# Patient Record
Sex: Female | Born: 1935 | Race: Black or African American | Hispanic: No | Smoking: Never smoker
Health system: Southern US, Community
[De-identification: ages and names within clinical notes are randomized; demographics above are authoritative.]

## PROBLEM LIST (undated history)

## (undated) DIAGNOSIS — R000 Tachycardia, unspecified: Secondary | ICD-10-CM

## (undated) DIAGNOSIS — I82409 Acute embolism and thrombosis of unspecified deep veins of unspecified lower extremity: Secondary | ICD-10-CM

## (undated) DIAGNOSIS — I119 Hypertensive heart disease without heart failure: Secondary | ICD-10-CM

## (undated) DIAGNOSIS — R011 Cardiac murmur, unspecified: Secondary | ICD-10-CM

## (undated) DIAGNOSIS — I739 Peripheral vascular disease, unspecified: Secondary | ICD-10-CM

## (undated) DIAGNOSIS — I2510 Atherosclerotic heart disease of native coronary artery without angina pectoris: Secondary | ICD-10-CM

## (undated) DIAGNOSIS — R0602 Shortness of breath: Secondary | ICD-10-CM

## (undated) DIAGNOSIS — I779 Disorder of arteries and arterioles, unspecified: Secondary | ICD-10-CM

## (undated) DIAGNOSIS — M1990 Unspecified osteoarthritis, unspecified site: Secondary | ICD-10-CM

## (undated) DIAGNOSIS — I10 Essential (primary) hypertension: Secondary | ICD-10-CM

## (undated) DIAGNOSIS — E7800 Pure hypercholesterolemia, unspecified: Secondary | ICD-10-CM

## (undated) DIAGNOSIS — G459 Transient cerebral ischemic attack, unspecified: Secondary | ICD-10-CM

## (undated) HISTORY — DX: Disorder of arteries and arterioles, unspecified: I77.9

## (undated) HISTORY — PX: CATARACT EXTRACTION W/ INTRAOCULAR LENS  IMPLANT, BILATERAL: SHX1307

---

## 2003-05-19 ENCOUNTER — Encounter: Admission: RE | Admit: 2003-05-19 | Discharge: 2003-06-07 | Payer: Self-pay | Admitting: Internal Medicine

## 2005-03-06 ENCOUNTER — Encounter: Admission: RE | Admit: 2005-03-06 | Discharge: 2005-03-06 | Payer: Self-pay | Admitting: Internal Medicine

## 2007-02-18 ENCOUNTER — Emergency Department (HOSPITAL_COMMUNITY): Admission: EM | Admit: 2007-02-18 | Discharge: 2007-02-18 | Payer: Self-pay | Admitting: Emergency Medicine

## 2007-04-14 ENCOUNTER — Inpatient Hospital Stay (HOSPITAL_COMMUNITY): Admission: EM | Admit: 2007-04-14 | Discharge: 2007-04-18 | Payer: Self-pay | Admitting: Emergency Medicine

## 2007-04-16 ENCOUNTER — Encounter (INDEPENDENT_AMBULATORY_CARE_PROVIDER_SITE_OTHER): Payer: Self-pay | Admitting: *Deleted

## 2007-04-16 ENCOUNTER — Ambulatory Visit: Payer: Self-pay | Admitting: Vascular Surgery

## 2007-04-16 ENCOUNTER — Encounter (INDEPENDENT_AMBULATORY_CARE_PROVIDER_SITE_OTHER): Payer: Self-pay | Admitting: Cardiology

## 2007-08-10 ENCOUNTER — Emergency Department (HOSPITAL_COMMUNITY): Admission: EM | Admit: 2007-08-10 | Discharge: 2007-08-10 | Payer: Self-pay | Admitting: Emergency Medicine

## 2008-06-25 ENCOUNTER — Emergency Department (HOSPITAL_COMMUNITY): Admission: EM | Admit: 2008-06-25 | Discharge: 2008-06-25 | Payer: Self-pay | Admitting: Emergency Medicine

## 2008-07-06 ENCOUNTER — Emergency Department (HOSPITAL_COMMUNITY): Admission: EM | Admit: 2008-07-06 | Discharge: 2008-07-06 | Payer: Self-pay | Admitting: Emergency Medicine

## 2008-12-20 ENCOUNTER — Emergency Department (HOSPITAL_COMMUNITY): Admission: EM | Admit: 2008-12-20 | Discharge: 2008-12-21 | Payer: Self-pay | Admitting: Emergency Medicine

## 2009-01-21 ENCOUNTER — Inpatient Hospital Stay (HOSPITAL_COMMUNITY): Admission: EM | Admit: 2009-01-21 | Discharge: 2009-01-24 | Payer: Self-pay | Admitting: Emergency Medicine

## 2009-01-23 ENCOUNTER — Encounter (INDEPENDENT_AMBULATORY_CARE_PROVIDER_SITE_OTHER): Payer: Self-pay | Admitting: Internal Medicine

## 2009-12-06 ENCOUNTER — Emergency Department (HOSPITAL_COMMUNITY): Admission: EM | Admit: 2009-12-06 | Discharge: 2009-12-07 | Payer: Self-pay | Admitting: Emergency Medicine

## 2009-12-11 ENCOUNTER — Encounter: Admission: RE | Admit: 2009-12-11 | Discharge: 2009-12-11 | Payer: Self-pay | Admitting: Internal Medicine

## 2010-05-10 LAB — URINALYSIS, ROUTINE W REFLEX MICROSCOPIC
Nitrite: NEGATIVE
Protein, ur: 100 mg/dL — AB
Specific Gravity, Urine: 1.017 (ref 1.005–1.030)
Urobilinogen, UA: 1 mg/dL (ref 0.0–1.0)

## 2010-05-10 LAB — URINE MICROSCOPIC-ADD ON

## 2010-05-10 LAB — POCT I-STAT, CHEM 8
BUN: 14 mg/dL (ref 6–23)
Calcium, Ion: 1.16 mmol/L (ref 1.12–1.32)
Chloride: 95 mEq/L — ABNORMAL LOW (ref 96–112)
Potassium: 3.4 mEq/L — ABNORMAL LOW (ref 3.5–5.1)

## 2010-05-10 LAB — DIFFERENTIAL
Basophils Absolute: 0 10*3/uL (ref 0.0–0.1)
Lymphocytes Relative: 7 % — ABNORMAL LOW (ref 12–46)
Monocytes Absolute: 0.9 10*3/uL (ref 0.1–1.0)
Monocytes Relative: 9 % (ref 3–12)
Neutro Abs: 9.2 10*3/uL — ABNORMAL HIGH (ref 1.7–7.7)

## 2010-05-10 LAB — HEPATIC FUNCTION PANEL
ALT: 14 U/L (ref 0–35)
AST: 27 U/L (ref 0–37)
Alkaline Phosphatase: 39 U/L (ref 39–117)
Indirect Bilirubin: 0.7 mg/dL (ref 0.3–0.9)
Total Protein: 6.3 g/dL (ref 6.0–8.3)

## 2010-05-10 LAB — CBC
HCT: 34.8 % — ABNORMAL LOW (ref 36.0–46.0)
Hemoglobin: 12.1 g/dL (ref 12.0–15.0)
WBC: 10.9 10*3/uL — ABNORMAL HIGH (ref 4.0–10.5)

## 2010-05-10 LAB — POCT CARDIAC MARKERS
CKMB, poc: 1.5 ng/mL (ref 1.0–8.0)
Troponin i, poc: <0.05 ng/mL (ref 0.00–0.09)

## 2010-05-30 LAB — LIPID PANEL: Triglycerides: 176 mg/dL — ABNORMAL HIGH (ref <150)

## 2010-05-30 LAB — BASIC METABOLIC PANEL
BUN: 13 mg/dL (ref 6–23)
BUN: 7 mg/dL (ref 6–23)
BUN: 7 mg/dL (ref 6–23)
CO2: 22 mEq/L (ref 19–32)
CO2: 25 mEq/L (ref 19–32)
CO2: 26 mEq/L (ref 19–32)
CO2: 26 mEq/L (ref 19–32)
Calcium: 9.1 mg/dL (ref 8.4–10.5)
Calcium: 9.4 mg/dL (ref 8.4–10.5)
Chloride: 101 mEq/L (ref 96–112)
Chloride: 106 mEq/L (ref 96–112)
Chloride: 109 mEq/L (ref 96–112)
Creatinine, Ser: 0.85 mg/dL (ref 0.4–1.2)
Creatinine, Ser: 0.85 mg/dL (ref 0.4–1.2)
Creatinine, Ser: 0.93 mg/dL (ref 0.4–1.2)
GFR calc Af Amer: >60 mL/min (ref >60)
GFR calc non Af Amer: 56 mL/min — ABNORMAL LOW (ref >60)
Glucose, Bld: 110 mg/dL — ABNORMAL HIGH (ref 70–99)
Glucose, Bld: 96 mg/dL (ref 70–99)
Glucose, Bld: 99 mg/dL (ref 70–99)
Potassium: 3.2 mEq/L — ABNORMAL LOW (ref 3.5–5.1)
Potassium: 4.1 mEq/L (ref 3.5–5.1)

## 2010-05-30 LAB — CBC
HCT: 39.7 % (ref 36.0–46.0)
Hemoglobin: 13.5 g/dL (ref 12.0–15.0)
MCV: 95.7 fL (ref 78.0–100.0)
RBC: 4.17 MIL/uL (ref 3.87–5.11)
RDW: 13.7 % (ref 11.5–15.5)
WBC: 10.8 10*3/uL — ABNORMAL HIGH (ref 4.0–10.5)
WBC: 7.1 10*3/uL (ref 4.0–10.5)

## 2010-05-30 LAB — CARDIAC PANEL(CRET KIN+CKTOT+MB+TROPI)
CK, MB: 1.6 ng/mL (ref 0.3–4.0)
CK, MB: 1.7 ng/mL (ref 0.3–4.0)
CK, MB: 3 ng/mL (ref 0.3–4.0)
Relative Index: INVALID (ref 0.0–2.5)
Relative Index: INVALID (ref 0.0–2.5)
Total CK: 79 U/L (ref 7–177)
Total CK: 99 U/L (ref 7–177)
Troponin I: 0.04 ng/mL (ref 0.00–0.06)
Troponin I: 0.19 ng/mL — ABNORMAL HIGH (ref 0.00–0.06)

## 2010-05-30 LAB — URINALYSIS, ROUTINE W REFLEX MICROSCOPIC
Glucose, UA: NEGATIVE mg/dL
pH: 7 (ref 5.0–8.0)

## 2010-05-30 LAB — DIGOXIN LEVEL: Digoxin Level: 0.8 ng/mL (ref 0.8–2.0)

## 2010-05-30 LAB — DIFFERENTIAL
Eosinophils Relative: 2 % (ref 0–5)
Lymphocytes Relative: 23 % (ref 12–46)
Lymphs Abs: 1.7 10*3/uL (ref 0.7–4.0)
Monocytes Absolute: 0.8 10*3/uL (ref 0.1–1.0)

## 2010-05-30 LAB — CK TOTAL AND CKMB (NOT AT ARMC)
CK, MB: 1.5 ng/mL (ref 0.3–4.0)
Total CK: 74 U/L (ref 7–177)

## 2010-05-30 LAB — TSH: TSH: 1.068 u[IU]/mL (ref 0.350–4.500)

## 2010-05-30 LAB — URINE MICROSCOPIC-ADD ON

## 2010-05-30 LAB — BRAIN NATRIURETIC PEPTIDE: Pro B Natriuretic peptide (BNP): 191 pg/mL — ABNORMAL HIGH (ref 0.0–100.0)

## 2010-05-30 LAB — URINE CULTURE

## 2010-05-30 LAB — POCT CARDIAC MARKERS

## 2010-05-31 LAB — POCT CARDIAC MARKERS
CKMB, poc: <1 ng/mL — ABNORMAL LOW (ref 1.0–8.0)
Myoglobin, poc: 54.6 ng/mL (ref 12–200)
Myoglobin, poc: 77.7 ng/mL (ref 12–200)
Troponin i, poc: <0.05 ng/mL (ref 0.00–0.09)

## 2010-05-31 LAB — DIFFERENTIAL
Basophils Absolute: 0 10*3/uL (ref 0.0–0.1)
Basophils Relative: 0 % (ref 0–1)
Eosinophils Relative: 1 % (ref 0–5)
Lymphocytes Relative: 16 % (ref 12–46)
Monocytes Absolute: 0.4 10*3/uL (ref 0.1–1.0)
Neutro Abs: 5.8 10*3/uL (ref 1.7–7.7)

## 2010-05-31 LAB — HEPATIC FUNCTION PANEL
ALT: 16 U/L (ref 0–35)
Albumin: 3.5 g/dL (ref 3.5–5.2)
Alkaline Phosphatase: 42 U/L (ref 39–117)
Total Protein: 6.7 g/dL (ref 6.0–8.3)

## 2010-05-31 LAB — CBC
MCHC: 34.1 g/dL (ref 30.0–36.0)
Platelets: 191 10*3/uL (ref 150–400)
RDW: 13.9 % (ref 11.5–15.5)

## 2010-05-31 LAB — DIGOXIN LEVEL: Digoxin Level: 0.3 ng/mL — ABNORMAL LOW (ref 0.8–2.0)

## 2010-05-31 LAB — POCT I-STAT, CHEM 8: Glucose, Bld: 189 mg/dL — ABNORMAL HIGH (ref 70–99)

## 2010-05-31 LAB — LIPASE, BLOOD: Lipase: 30 U/L (ref 11–59)

## 2010-06-05 LAB — CBC
HCT: 36.2 % (ref 36.0–46.0)
Hemoglobin: 12.4 g/dL (ref 12.0–15.0)
MCHC: 34.3 g/dL (ref 30.0–36.0)
RBC: 3.87 MIL/uL (ref 3.87–5.11)
RDW: 12.3 % (ref 11.5–15.5)

## 2010-06-05 LAB — DIFFERENTIAL
Eosinophils Relative: 2 % (ref 0–5)
Lymphocytes Relative: 22 % (ref 12–46)
Monocytes Absolute: 1 10*3/uL (ref 0.1–1.0)
Monocytes Relative: 14 % — ABNORMAL HIGH (ref 3–12)
Neutro Abs: 4.3 10*3/uL (ref 1.7–7.7)

## 2010-06-05 LAB — POCT CARDIAC MARKERS: Troponin i, poc: <0.05 ng/mL (ref 0.00–0.09)

## 2010-06-05 LAB — POCT I-STAT, CHEM 8
BUN: 19 mg/dL (ref 6–23)
Calcium, Ion: 1.26 mmol/L (ref 1.12–1.32)
Chloride: 103 mEq/L (ref 96–112)
Glucose, Bld: 113 mg/dL — ABNORMAL HIGH (ref 70–99)
TCO2: 26 mmol/L (ref 0–100)

## 2010-07-10 NOTE — Discharge Summary (Signed)
NAME:  SANDY, HAYE NO.:  [id number]   MEDICAL RECORD NO.:  [id number]          PATIENT TYPE:  INP   LOCATION:  3002                         FACILITY:  [facility]   PHYSICIAN:  Thomasenia Bottoms, MDDATE OF BIRTH:  Mar 19, 1914   DATE OF ADMISSION:  04/13/2007  DATE OF DISCHARGE:  04/18/2007                               DISCHARGE SUMMARY   SUMMARY OF HOSPITAL COURSE:  Ms. Botto is a very pleasant, independent  77 year old woman who came in with complaints of right-sided weakness.  She does also report that several days prior to presentation she had  severe left-sided weakness which resolved.  The CT scan in the emergency  department revealed a new lacunar infarct in the right internal capsule  and some chronic ischemic changes.  The patient was admitted to the  telemetry unit where she had a complete workup.  Her symptoms resolved.  She was seen by physical therapy and home PT was recommended, but  clinically she did not have a significant weakness on exam.  She does  walk with a walker and the patient does live by herself.  Physical  therapy did recommend that she have 24-hour supervision.   SUMMARY OF STUDIES DONE DURING THIS HOSPITAL STAY:  Include brain MRI  and MRA which revealed the small acute infarct in the posterior limb of  the right internal capsule, some atrophy and mild chronic ischemia.  She  also had intracranial atherosclerosis including moderate stenosis of the  proximal posterior cerebral arteries bilaterally.  The left vertebral  artery was not visualized and may be occluded or hyperplastic.  Mild  atherosclerotic irregularity in the middle cerebral arteries bilaterally  as well, but no evidence of large-vessel occlusion.  The patient also  had an echocardiogram which revealed a small left ventricle with an EF  of 75-80%, increased thickness of the left ventricular wall, but no  evidence of wall motion abnormalities.  She had a small left  ventricle,  small right ventricle, mild calcification of the mitral valve and it was  felt that her findings were consistent with hypertrophic cardiomyopathy.  The patient's hemoglobin A1c was 6.4.  Urine culture revealed greater  than 100,000 Klebsiella pneumonia which were sensitive to Cipro.  Her  hemoglobin 11.8, last white count was 11.9.  Homocysteine level within  normal limits at 10.5.  TSH within normal limits at 0.976.  The  patient's fasting cholesterol was 203 with an LDL of 134, triglycerides  high at 191, and LDL low at 31.   DISCHARGE DIAGNOSES:  1. Acute right internal capsule cerebrovascular accident with no      significant residual weakness.  The patient was on aspirin at the      time of the stroke so she will be switched to Plavix.  2. Distant history of atrial fibrillation.  I had a very long      discussion with the patient and her family about the data      recommending Coumadin for people with history of atrial      fibrillation particularly in the setting of a stroke, but the  family and the patient were very clear that she did not want to      take Coumadin so because of this she will be sent home on Plavix as      mentioned above.  The patient's EKGs here reveal that she is in      normal sinus rhythm.  3. Hypercholesterolemia.  The patient was already taking a statin but      we will increase the dose from 20 mg to 40 mg as her LDL was still      greater than 100.  4. Hypertension.  Her blood pressure was controlled while she was here      in the hospital.  5. Hypokalemia.  This was repleted.  I suspect her hydrochlorothiazide      has caused this.  The patient may be able to come off the      hydrochlorothiazide as well as the potassium if her primary care      doctors agree with this.  6. Diabetes mellitus.  The patient does not require medication.  She      does not really follow any sort of diet.  As a matter of fact, she      did not know that  she had diabetes.  I have recommended diet      control.  Her hemoglobin A1c was 6.4.  7. Klebsiella urinary tract infection.  The patient will be treated      for a total of 5 days with Cipro.  8. Hypertrophic cardiomyopathy, likely secondary to hypertension.   MEDICATIONS AT THE TIME OF DISCHARGE:  1. Plavix 75 mg p.o. daily, which is new.  We have asked her to      discontinue her aspirin 325 mg.  2. Pravastatin 40 mg p.o. q.h.s. which is increased from 20.  3. Lanoxin 0.125 mg p.o. daily as before.  4. Atenolol 50 mg one-half tablet daily as before.  5. Altace 2.5 mg p.o. daily.  This ACE inhibitor is new.  6. She is on Alphagan eye drops 0.15% one drop in each eye twice      daily.  7. Timolol eye drops one drop in each eye twice daily.   The patient is being asked to follow up with Dr. Nicholos Johns within the  next 2 weeks.      Thomasenia Bottoms, MD  Electronically Signed     CVC/MEDQ  D:  04/18/2007  T:  04/19/2007  Job:  [id number]   cc:   Georgianne Fick, M.D.

## 2010-07-10 NOTE — H&P (Signed)
NAME:  Pamela Hayes, Pamela Hayes              ACCOUNT NO.:  [id number]   MEDICAL RECORD NO.:  [id number]          PATIENT TYPE:  INP   LOCATION:  1823                         FACILITY:  [facility]   PHYSICIAN:  Darryl D. Prime, MD    DATE OF BIRTH:  [birth year]   DATE OF ADMISSION:  04/13/2007  DATE OF DISCHARGE:                              HISTORY & PHYSICAL   PRIMARY CARE PHYSICIAN:  Georgianne Fick, M.D.   She will be DNR after a discussion with the patient directly.  The  patient total visit time was approximately 45 minutes.  The patient was  the historian.  She is a good historian.   CHIEF COMPLAINT:  Numbness of the right arm and weakness.   HISTORY OF PRESENT ILLNESS:  Ms. Soderholm is a 77 year old female who  presents tonight with complaints of right-sided weakness which began on  February 16 in the daytime.  She is unsure of the exact time.  The  patient apparently notes initially pain in the right arm, which was then  followed by numbness and significant weakness.  She notes that only now  since being in the ER have these symptoms completely resolved.  A week  ago she notes pain and numbness in the left arm and the pain radiated to  the chest area but did not seek medical attention.  Apparently she was  seen in the emergency room for possible syncope in December of 2008.  The patient takes her medication at home and she notes also taking four  baby aspirins when she had these symptoms earlier.  She denies any  dysuria, hematuria.  She denies any black stools or bloody stools.  She  denies any fevers, coughs, or rash.  She denies any worsening joint  pains or swelling in the joints.  The patient performs all her  activities of daily living with minor assistance from her family who  lives nearby.   PAST MEDICAL/SURGICAL HISTORY:  As above.  1. She also has a history of hypertension.  2. History of possible cardiac arrhythmia.  3. History of hyperlipidemia.  4. History of  osteopenia.  5. History of hypokalemia.   MEDICATIONS:  1. She is on Tylenol 25 mg daily.  2. Hydrochlorothiazide 50 mg daily.  3. Digoxin 0.125 mg daily.  4. She is on Pravastatin 20 mg daily.  5. Potassium chloride in the form of Klor-Con 20 daily.  6. Aspirin 325 mg daily.  7. Calcium oyster shells 500 mg three times a day.  8. The patient is also on antacid called Titralac 100 mg twice a day.  9. She is also on a multivitamin.   SOCIAL HISTORY:  She denies ever using tobacco, alcohol, or drugs.  She  lives by herself.   FAMILY HISTORY:  Positive for coronary disease in the father and  multiple strokes in her mother before they passed away.   ALLERGIES:  She has no known drug allergies.   REVIEW OF SYSTEMS:  A 14-point review of systems was negative unless  stated above.   PHYSICAL EXAMINATION:  VITAL SIGNS:  Temperature is 97.4  with her blood  pressure 141/80, pulse was 76, respiratory rate was 19, saturations 96%  on room air.  GENERAL:  She is a female that looks much younger than her stated age,  lying in bed in no acute distress.  HEENT:  Normocephalic, atraumatic.  Her pupils are equal, round, and  reactive to light with extraocular movements being intact.  Nares show  no lesions.  Oropharynx with no posterior pharyngeal lesions.  NECK:  Supple with no lymphadenopathy, no thyromegaly.  No carotid  bruits appreciated.  No jugular venous distention.  CARDIOVASCULAR:  Regular rhythm and rate.  There is what appears to be a  2/6 holosystolic murmur, loudest at the apex laterally across the  precordium with maybe radiation to the left axilla area.  LUNGS:  Clear to auscultation bilaterally.  ABDOMEN:  Obese, soft, nontender, nondistended.  EXTREMITIES:  Showed no clubbing, cyanosis, or edema.  NEUROLOGIC:  Sensation is grossly intact.  Strength is 5/5 throughout.  Cranial nerves II-XII are grossly intact.  Deep tendon reflexes are  intact.   LABORATORY DATA:   Creatinine 1.1, sodium 140, potassium 3.5, chloride  104, bicarb 3.5, BUN 19.  Her white blood cell count was 9.3, platelets  199.  Her glucose is 112.  Troponin was 0.01 at approximately midnight.  CK-MB was 1.3, CK 90.   The patient's EKG showed normal sinus rhythm at a rate of 64 with left  ventricular hypertrophy with repolarization abnormalities.  Chest x-ray  showed a widened mediastinum, hepatic aorta and signs of kyphosis and  osteopenia.  There was no cardiomegaly.  CT of the head without contrast  was concerning from a right lacunar stroke which is possibly old.   ASSESSMENT/PLAN:  This is a patient with a family history positive for  strokes and history concerning for possible transient ischemic attacks.  She will be admitted with neuro checks q.4 h. for telemetry and she will  be on Aggrenox and folic acid.  Will get an MRI in the a.m. and also  ultrasound transcranial Dopplers throughout source of stroke.  These  things are significant to get a neurology consult.  For possible mitral  regurgitant murmur,  we will get an echocardiogram as the physical exam  is not clear-cut.  For her arrhythmia history will get a digoxin level  and place her on telemetry.  Will check a TSH as well.  She will be on  aspirin.  Doubt this is the cause of the symptoms, but certainly may put  her at increased risk for stroke if she has atrial fibrillation.  For  her possible chest pain a week ago will get cardiac markers x3 and EKG  in the morning.  She will be Do Not Resuscitate after discussion with  the patient.  For her hyperlipidemia history and recent possible TIAs,  will check a lipid panel and will increase her Pravastatin to 40 mg  daily, DVT prophylaxis with an apparent GI prophylaxis with a proton  pump inhibitor.      Darryl D. Prime, MD  Electronically Signed     DDP/MEDQ  D:  04/14/2007  T:  04/14/2007  Job:  [id number]

## 2010-11-16 LAB — BASIC METABOLIC PANEL
BUN: 18
BUN: 3 — ABNORMAL LOW
BUN: 7
CO2: 25
CO2: 25
CO2: 25
Calcium: 8.4
Calcium: 8.9
Calcium: 9
Chloride: 103
Chloride: 103
Chloride: 107
Creatinine, Ser: 0.91
Creatinine, Ser: 0.96
Creatinine, Ser: 0.98
GFR calc Af Amer: >60
GFR calc Af Amer: >60
GFR calc Af Amer: >60
GFR calc non Af Amer: 53 — ABNORMAL LOW
GFR calc non Af Amer: 55 — ABNORMAL LOW
GFR calc non Af Amer: 58 — ABNORMAL LOW
Glucose, Bld: 125 — ABNORMAL HIGH
Glucose, Bld: 140 — ABNORMAL HIGH
Glucose, Bld: 159 — ABNORMAL HIGH
Glucose, Bld: 167 — ABNORMAL HIGH
Potassium: 2.8 — ABNORMAL LOW
Potassium: 3.1 — ABNORMAL LOW
Potassium: 3.3 — ABNORMAL LOW
Potassium: 3.5
Sodium: 137
Sodium: 138
Sodium: 139

## 2010-11-16 LAB — I-STAT 8, (EC8 V) (CONVERTED LAB)
Acid-Base Excess: 2
BUN: 19
Bicarbonate: 27.2 — ABNORMAL HIGH
Chloride: 104
Glucose, Bld: 112 — ABNORMAL HIGH
HCT: 43
Hemoglobin: 14.6
Operator id: 282201
Potassium: 3.5
Sodium: 140
TCO2: 29
pCO2, Ven: 43.7 — ABNORMAL LOW
pH, Ven: 7.403 — ABNORMAL HIGH

## 2010-11-16 LAB — PROTIME-INR: INR: 1.1

## 2010-11-16 LAB — URINE CULTURE
Colony Count: >=100000
Special Requests: NEGATIVE

## 2010-11-16 LAB — CARDIAC PANEL(CRET KIN+CKTOT+MB+TROPI)
Relative Index: INVALID
Total CK: 94
Troponin I: 0.02

## 2010-11-16 LAB — DIFFERENTIAL
Basophils Relative: 0
Eosinophils Absolute: 0.2
Eosinophils Relative: 2
Monocytes Absolute: 0.6
Monocytes Relative: 8

## 2010-11-16 LAB — CBC
HCT: 34.9 — ABNORMAL LOW
HCT: 39.8
Hemoglobin: 11.8 — ABNORMAL LOW
Hemoglobin: 12.6
Hemoglobin: 13.5
MCHC: 33.7
MCHC: 33.8
MCHC: 33.9
MCV: 93.8
MCV: 93.9
MCV: 95
Platelets: 194
Platelets: 199
RBC: 3.72 — ABNORMAL LOW
RBC: 3.94
RBC: 4.24
RDW: 13.2
RDW: 13.4
WBC: 11.9 — ABNORMAL HIGH
WBC: 9.3

## 2010-11-16 LAB — URINALYSIS, ROUTINE W REFLEX MICROSCOPIC
Hgb urine dipstick: NEGATIVE
Nitrite: NEGATIVE
Protein, ur: 30 — AB
Urobilinogen, UA: 1

## 2010-11-16 LAB — LIPID PANEL: Triglycerides: 191 — ABNORMAL HIGH

## 2010-11-16 LAB — HEMOGLOBIN A1C
Hgb A1c MFr Bld: 6.4 — ABNORMAL HIGH
Mean Plasma Glucose: 151

## 2010-11-16 LAB — TROPONIN I: Troponin I: 0.01

## 2010-11-16 LAB — URINE MICROSCOPIC-ADD ON

## 2010-11-16 LAB — APTT: aPTT: 25

## 2010-11-16 LAB — CK TOTAL AND CKMB (NOT AT ARMC)
CK, MB: 1.3
Relative Index: INVALID
Total CK: 90

## 2010-11-16 LAB — POCT I-STAT CREATININE
Creatinine, Ser: 1.1
Operator id: 282201

## 2010-11-22 LAB — DIFFERENTIAL
Basophils Absolute: 0
Basophils Relative: 0
Neutro Abs: 4.9
Neutrophils Relative %: 63

## 2010-11-22 LAB — CBC
MCHC: 33.7
RBC: 3.91
RDW: 13.5

## 2010-11-22 LAB — POCT CARDIAC MARKERS
CKMB, poc: <1 — ABNORMAL LOW
CKMB, poc: <1 — ABNORMAL LOW
Myoglobin, poc: 46.2

## 2010-11-22 LAB — URINALYSIS, ROUTINE W REFLEX MICROSCOPIC
Nitrite: NEGATIVE
Specific Gravity, Urine: 1.01
Urobilinogen, UA: 0.2

## 2010-11-22 LAB — URINE MICROSCOPIC-ADD ON

## 2010-11-22 LAB — POCT I-STAT, CHEM 8
HCT: 37
Hemoglobin: 12.6
Potassium: 3.6
Sodium: 133 — ABNORMAL LOW

## 2010-11-30 LAB — DIFFERENTIAL
Basophils Absolute: 0
Basophils Relative: 0
Monocytes Relative: 7
Neutro Abs: 4.8
Neutrophils Relative %: 52

## 2010-11-30 LAB — CBC
MCHC: 34.1
RBC: 4.01
WBC: 9.3

## 2010-11-30 LAB — URINALYSIS, ROUTINE W REFLEX MICROSCOPIC
Glucose, UA: NEGATIVE
Hgb urine dipstick: NEGATIVE
Ketones, ur: NEGATIVE
pH: 7.5

## 2010-11-30 LAB — I-STAT 8, (EC8 V) (CONVERTED LAB)
Glucose, Bld: 165 — ABNORMAL HIGH
HCT: 41
Hemoglobin: 13.9
Operator id: 284141
Potassium: 3.2 — ABNORMAL LOW
Sodium: 136
TCO2: 26

## 2010-11-30 LAB — URINE MICROSCOPIC-ADD ON

## 2010-11-30 LAB — POCT I-STAT CREATININE: Operator id: 284141

## 2011-10-04 ENCOUNTER — Emergency Department (HOSPITAL_COMMUNITY): Payer: Medicare Other

## 2011-10-04 ENCOUNTER — Emergency Department (HOSPITAL_COMMUNITY)
Admission: EM | Admit: 2011-10-04 | Discharge: 2011-10-05 | Disposition: A | Discharge status: Home / Self Care | Payer: Medicare Other | Attending: Emergency Medicine | Admitting: Emergency Medicine

## 2011-10-04 ENCOUNTER — Encounter (HOSPITAL_COMMUNITY): Payer: Self-pay | Admitting: *Deleted

## 2011-10-04 DIAGNOSIS — Z86718 Personal history of other venous thrombosis and embolism: Secondary | ICD-10-CM | POA: Insufficient documentation

## 2011-10-04 DIAGNOSIS — I10 Essential (primary) hypertension: Secondary | ICD-10-CM | POA: Insufficient documentation

## 2011-10-04 DIAGNOSIS — I2510 Atherosclerotic heart disease of native coronary artery without angina pectoris: Secondary | ICD-10-CM | POA: Insufficient documentation

## 2011-10-04 DIAGNOSIS — L0390 Cellulitis, unspecified: Secondary | ICD-10-CM

## 2011-10-04 DIAGNOSIS — F17200 Nicotine dependence, unspecified, uncomplicated: Secondary | ICD-10-CM | POA: Insufficient documentation

## 2011-10-04 DIAGNOSIS — E119 Type 2 diabetes mellitus without complications: Secondary | ICD-10-CM | POA: Insufficient documentation

## 2011-10-04 DIAGNOSIS — M7989 Other specified soft tissue disorders: Secondary | ICD-10-CM | POA: Insufficient documentation

## 2011-10-04 DIAGNOSIS — Z79899 Other long term (current) drug therapy: Secondary | ICD-10-CM | POA: Insufficient documentation

## 2011-10-04 HISTORY — DX: Atherosclerotic heart disease of native coronary artery without angina pectoris: I25.10

## 2011-10-04 HISTORY — DX: Essential (primary) hypertension: I10

## 2011-10-04 HISTORY — DX: Cardiac murmur, unspecified: R01.1

## 2011-10-04 LAB — CBC WITH DIFFERENTIAL/PLATELET
Basophils Relative: 0 % (ref 0–1)
Eosinophils Absolute: 0.2 10*3/uL (ref 0.0–0.7)
Eosinophils Relative: 3 % (ref 0–5)
Hemoglobin: 12.8 g/dL (ref 12.0–15.0)
Lymphs Abs: 1.8 10*3/uL (ref 0.7–4.0)
MCH: 31.4 pg (ref 26.0–34.0)
MCHC: 34.9 g/dL (ref 30.0–36.0)
MCV: 90.2 fL (ref 78.0–100.0)
Monocytes Absolute: 0.7 10*3/uL (ref 0.1–1.0)
Monocytes Relative: 11 % (ref 3–12)
RBC: 4.07 MIL/uL (ref 3.87–5.11)

## 2011-10-04 LAB — PROTIME-INR
INR: 1.04 (ref 0.00–1.49)
Prothrombin Time: 13.8 seconds (ref 11.6–15.2)

## 2011-10-04 LAB — COMPREHENSIVE METABOLIC PANEL
Alkaline Phosphatase: 50 U/L (ref 39–117)
BUN: 9 mg/dL (ref 6–23)
Calcium: 9.4 mg/dL (ref 8.4–10.5)
Creatinine, Ser: 0.82 mg/dL (ref 0.50–1.10)
GFR calc Af Amer: 69 mL/min — ABNORMAL LOW (ref >90)
Glucose, Bld: 107 mg/dL — ABNORMAL HIGH (ref 70–99)
Potassium: 3.2 mEq/L — ABNORMAL LOW (ref 3.5–5.1)
Total Protein: 7.4 g/dL (ref 6.0–8.3)

## 2011-10-04 MED ORDER — ENOXAPARIN SODIUM 60 MG/0.6ML ~~LOC~~ SOLN
60.0000 mg | Freq: Once | SUBCUTANEOUS | Status: AC
  Administered 2011-10-05: 60 mg via SUBCUTANEOUS
  Filled 2011-10-04: qty 0.6

## 2011-10-04 MED ORDER — CEPHALEXIN 500 MG PO CAPS: 500.0000 mg | ORAL_CAPSULE | Freq: Four times a day (QID) | ORAL | Status: AC

## 2011-10-04 MED ORDER — CEPHALEXIN 250 MG PO CAPS
500.0000 mg | ORAL_CAPSULE | Freq: Once | ORAL | Status: AC
  Administered 2011-10-05: 500 mg via ORAL
  Filled 2011-10-04: qty 2

## 2011-10-04 NOTE — ED Notes (Addendum)
Lt. Ankle swollen. No hx. Of blood clots. Sore to touch foot and when walking. Ankle warm to touch. Redness to lt, medial side of ankle.

## 2011-10-04 NOTE — ED Provider Notes (Signed)
History     CSN: [id number]  Arrival date & time 10/04/11  1827   First MD Initiated Contact with Patient 10/04/11 2216      Chief Complaint  Patient presents with  . Leg Swelling    (Consider location/radiation/quality/duration/timing/severity/associated sxs/prior treatment) HPI Comments: CAMPBELL AGRAMONTE is a 78 y.o. Female hx of HTN, R leg DVT, DM here with leg swelling. L leg swelling for the last few weeks. She might have fell a week ago and the swelling became worse. She denies any chest pain or SOB or fevers. She had remote hx of DVT and was not coumadin.     The history is provided by the patient.    Past Medical History  Diagnosis Date  . Heart murmur   . Coronary artery disease   . Hypertension     History reviewed. No pertinent past surgical history.  No family history on file.  History  Substance Use Topics  . Smoking status: Current Everyday Smoker  . Smokeless tobacco: Not on file  . Alcohol Use: No    OB History    Grav Para Term Preterm Abortions TAB SAB Ect Mult Living                  Review of Systems  Musculoskeletal: Positive for joint swelling.  All other systems reviewed and are negative.    Allergies  Review of patient's allergies indicates no known allergies.  Home Medications   Current Outpatient Rx  Name Route Sig Dispense Refill  . ATENOLOL 50 MG PO TABS Oral Take 25 mg by mouth daily.    Marland Kitchen CLOPIDOGREL BISULFATE 75 MG PO TABS Oral Take 75 mg by mouth daily.    Marland Kitchen DIGOXIN 0.125 MG PO TABS Oral Take 0.125 mg by mouth daily.    . DORZOLAMIDE HCL-TIMOLOL MAL 22.3-6.8 MG/ML OP SOLN Both Eyes Place 1 drop into both eyes 2 (two) times daily.    Marland Kitchen HYDROCHLOROTHIAZIDE 25 MG PO TABS Oral Take 25 mg by mouth daily.    Marland Kitchen PRAVASTATIN SODIUM 40 MG PO TABS Oral Take 40 mg by mouth daily.    Marland Kitchen RISEDRONATE SODIUM 35 MG PO TABS Oral Take 35 mg by mouth every 7 (seven) days. with water on empty stomach, nothing by mouth or lie down for next 30  minutes.    Marland Kitchen VERAPAMIL HCL ER 180 MG PO CP24 Oral Take 180 mg by mouth daily.      BP 156/81  Pulse 59  Temp 98.5 F (36.9 C) (Oral)  Resp 19  SpO2 100%  Physical Exam  Constitutional: She is oriented to person, place, and time. She appears well-developed and well-nourished.  HENT:  Head: Normocephalic.  Eyes: Conjunctivae are normal. Pupils are equal, round, and reactive to light.  Neck: Normal range of motion. Neck supple.  Cardiovascular: Normal rate, regular rhythm and normal heart sounds.   Pulmonary/Chest: Effort normal and breath sounds normal.  Abdominal: Soft. Bowel sounds are normal.  Musculoskeletal:       L ankle swollen ? Area of erythema medial aspect. No calf tenderness.   Neurological: She is alert and oriented to person, place, and time.  Skin: Skin is warm.  Psychiatric: She has a normal mood and affect. Her behavior is normal. Judgment and thought content normal.    ED Course  Procedures (including critical care time)   Labs Reviewed  CBC WITH DIFFERENTIAL  COMPREHENSIVE METABOLIC PANEL  PROTIME-INR   No results found.  No diagnosis found.    MDM  ALANNI VADER is a 78 y.o. female hx of DVT here with L leg swelling and erythema. Likely some cellulitis but concerned for DVT. No signs of PE. Will get labs, xray ankle. Will also give a prescription of keflex. Will have patient come back for DVT study in the morning.         Richardean Canal, MD 10/05/11 0001

## 2011-10-05 ENCOUNTER — Ambulatory Visit (HOSPITAL_COMMUNITY)
Admission: RE | Admit: 2011-10-05 | Discharge: 2011-10-05 | Disposition: A | Discharge status: Home / Self Care | Payer: Medicare Other | Source: Ambulatory Visit | Attending: Emergency Medicine | Admitting: Emergency Medicine

## 2011-10-05 DIAGNOSIS — M79609 Pain in unspecified limb: Secondary | ICD-10-CM | POA: Insufficient documentation

## 2011-10-05 DIAGNOSIS — M7989 Other specified soft tissue disorders: Secondary | ICD-10-CM

## 2011-10-05 NOTE — ED Notes (Signed)
Pt discharged via wheelchair accompanied with daughters. Family and pt verbalizes understanding of pt to return for ultrasound in am.

## 2011-10-05 NOTE — Progress Notes (Signed)
VASCULAR LAB PRELIMINARY  PRELIMINARY  PRELIMINARY  PRELIMINARY  Let lower extremity venous Doppler completed.    Preliminary report:  There is no DVT or SVT noted in the left lower extremity.  Pamela Hayes, 10/05/2011, 10:09 AM

## 2012-02-03 ENCOUNTER — Ambulatory Visit: Payer: Medicare Other | Admitting: Physical Therapy

## 2012-02-12 ENCOUNTER — Ambulatory Visit: Payer: Medicare Other | Admitting: Physical Therapy

## 2012-11-20 ENCOUNTER — Ambulatory Visit (INDEPENDENT_AMBULATORY_CARE_PROVIDER_SITE_OTHER): Payer: Medicare Other | Admitting: Cardiovascular Disease

## 2012-11-20 ENCOUNTER — Encounter: Payer: Self-pay | Admitting: Cardiovascular Disease

## 2012-11-20 VITALS — BP 116/60 | HR 62 | Ht 61.0 in

## 2012-11-20 DIAGNOSIS — H409 Unspecified glaucoma: Secondary | ICD-10-CM

## 2012-11-20 DIAGNOSIS — I119 Hypertensive heart disease without heart failure: Secondary | ICD-10-CM

## 2012-11-20 DIAGNOSIS — I10 Essential (primary) hypertension: Secondary | ICD-10-CM

## 2012-11-20 DIAGNOSIS — R9431 Abnormal electrocardiogram [ECG] [EKG]: Secondary | ICD-10-CM

## 2012-11-20 DIAGNOSIS — I454 Nonspecific intraventricular block: Secondary | ICD-10-CM

## 2012-11-20 DIAGNOSIS — I455 Other specified heart block: Secondary | ICD-10-CM

## 2012-11-20 DIAGNOSIS — R609 Edema, unspecified: Secondary | ICD-10-CM

## 2012-11-20 DIAGNOSIS — E785 Hyperlipidemia, unspecified: Secondary | ICD-10-CM

## 2012-11-20 NOTE — Patient Instructions (Addendum)
Your physician has requested that you have an echocardiogram. Echocardiography is a painless test that uses sound waves to create images of your heart. It provides your doctor with information about the size and shape of your heart and how well your heart's chambers and valves are working. This procedure takes approximately one hour. There are no restrictions for this procedure.  Your physician recommends that you schedule a follow-up appointment in:  As needed   

## 2012-11-24 DIAGNOSIS — H409 Unspecified glaucoma: Secondary | ICD-10-CM | POA: Insufficient documentation

## 2012-11-24 DIAGNOSIS — I10 Essential (primary) hypertension: Secondary | ICD-10-CM | POA: Insufficient documentation

## 2012-11-24 DIAGNOSIS — E785 Hyperlipidemia, unspecified: Secondary | ICD-10-CM | POA: Insufficient documentation

## 2012-11-24 DIAGNOSIS — I454 Nonspecific intraventricular block: Secondary | ICD-10-CM | POA: Insufficient documentation

## 2012-11-24 DIAGNOSIS — I119 Hypertensive heart disease without heart failure: Secondary | ICD-10-CM | POA: Insufficient documentation

## 2012-11-24 DIAGNOSIS — R9431 Abnormal electrocardiogram [ECG] [EKG]: Secondary | ICD-10-CM | POA: Insufficient documentation

## 2012-11-24 NOTE — Progress Notes (Signed)
Patient ID: Pamela Hayes, female   DOB: 1914-11-11, 79 y.o.   MRN: [id number]     Reason for office visit Abnormal ECG  Pamela Hayes is a remarkable 79 yo woman, accompanied here today by two of her daughters. She has has HTN for well over 30 years, generally believed to be well treated. She also has hyperlipidemia and glaucoma, but otherwise is quite healthy. She is a little frail recently, but still participates in many activities. She has no history of stroke, MI, CHF or i\other serious cardiovascular problems. She does not have any angina or dyspnea at rest, but becomes occasionally breathless with activity and at times has orthostatic dizziness. She has been on plavix for years and her daughters think it was prescribed for a "minstroke".    No Known Allergies  Current Outpatient Prescriptions  Medication Sig Dispense Refill  . atenolol (TENORMIN) 50 MG tablet Take 25 mg by mouth daily.      . brimonidine (ALPHAGAN) 0.2 % ophthalmic solution Apply to eye 2 (two) times daily.      . clopidogrel (PLAVIX) 75 MG tablet Take 75 mg by mouth daily.      . digoxin (LANOXIN) 0.125 MG tablet Take 0.125 mg by mouth daily.      . dorzolamide-timolol (COSOPT) 22.3-6.8 MG/ML ophthalmic solution Place 1 drop into both eyes 2 (two) times daily.      . hydrochlorothiazide (HYDRODIURIL) 25 MG tablet Take 25 mg by mouth daily.      . pravastatin (PRAVACHOL) 40 MG tablet Take 40 mg by mouth daily.      . risedronate (ACTONEL) 35 MG tablet Take 35 mg by mouth every 7 (seven) days. with water on empty stomach, nothing by mouth or lie down for next 30 minutes.      . verapamil (VERELAN PM) 180 MG 24 hr capsule Take 180 mg by mouth daily.       No current facility-administered medications for this visit.    Past Medical History  Diagnosis Date  . Heart murmur   . Coronary artery disease   . Hypertension     No previous major surgeries.  No family history of inheritable disorders other than  HBP   History   Social History  . Marital Status: Widowed    Spouse Name: N/A    Number of Children: N/A  . Years of Education: N/A   Occupational History  . Not on file.   Social History Main Topics  . Smoking status: Never Smoker   . Smokeless tobacco: Not on file  . Alcohol Use: No  . Drug Use: No  . Sexual Activity: Not on file   Other Topics Concern  . Not on file   Social History Narrative  . No narrative on file    Review of systems: The patient specifically denies any chest pain at rest exertion, dyspnea at rest, orthopnea, paroxysmal nocturnal dyspnea, syncope, palpitations, focal neurological deficits, intermittent claudication, lower extremity edema, unexplained weight gain, cough, hemoptysis or wheezing.   PHYSICAL EXAM BP 116/60  Pulse 62  Ht 5\' 1"  (1.549 m)  General: Alert, oriented x3, no distress Head: no evidence of trauma, PERRL, EOMI, no exophtalmos or lid lag, no myxedema, no xanthelasma; normal ears, nose and oropharynx Neck: normal jugular venous pulsations and no hepatojugular reflux; brisk carotid pulses without delay and no carotid bruits Chest: clear to auscultation, no signs of consolidation by percussion or palpation, normal fremitus, symmetrical and full respiratory excursions Cardiovascular: normal  position and quality of the apical impulse, regular rhythm, normal first and second heart sounds, no murmurs, rubs ; prominent s4 Abdomen: no tenderness or distention, no masses by palpation, no abnormal pulsatility or arterial bruits, normal bowel sounds, no hepatosplenomegaly Extremities: no clubbing, cyanosis or edema; 2+ radial, ulnar and brachial pulses bilaterally; 2+ right femoral, posterior tibial and dorsalis pedis pulses; 2+ left femoral, posterior tibial and dorsalis pedis pulses; no subclavian or femoral bruits Neurological: grossly nonfocal    EKG: NSR, 1st degree AV block, LVH, left ward axis, widened QRS (118 ms) and prominent ST  depression in I, aVL and inverted T waves in almost all leads  The ECG in PCP's office is a little different - the QRS looks more like an incomplete RBBB and there is more prominent left axis deviation, just shy of LAFB. The repolarization changes are similar.  Lipid Panel     Component Value Date/Time   CHOL  Value: 203        ATP III CLASSIFICATION:  <200     mg/dL   Desirable  409-811  mg/dL   Borderline High  >=914    mg/dL   High       * 78/29/5621 0400   TRIG 176* 01/22/2009 0400   HDL 32* 01/22/2009 0400   CHOLHDL 6.3 01/22/2009 0400   VLDL 35 01/22/2009 0400   LDLCALC  Value: 136        Total Cholesterol/HDL:CHD Risk Coronary Heart Disease Risk Table                     Men   Women  1/2 Average Risk   3.4   3.3  Average Risk       5.0   4.4  2 X Average Risk   9.6   7.1  3 X Average Risk  23.4   11.0        Use the calculated Patient Ratio above and the CHD Risk Table to determine the patient's CHD Risk.        ATP III CLASSIFICATION (LDL):  <100     mg/dL   Optimal  308-657  mg/dL   Near or Above                    Optimal  130-159  mg/dL   Borderline  846-962  mg/dL   High  >952     mg/dL   Very High* 84/13/2440 0400    BMET    Component Value Date/Time   NA 134* 10/04/2011 2258   K 3.2* 10/04/2011 2258   CL 96 10/04/2011 2258   CO2 28 10/04/2011 2258   GLUCOSE 107* 10/04/2011 2258   BUN 9 10/04/2011 2258   CREATININE 0.82 10/04/2011 2258   CALCIUM 9.4 10/04/2011 2258   GFRNONAA 59* 10/04/2011 2258   GFRAA 69* 10/04/2011 2258     ASSESSMENT AND PLAN No problem-specific assessment & plan notes found for this encounter.  Orders Placed This Encounter  Procedures  . EKG 12-Lead  . 2D Echocardiogram without contrast   Meds ordered this encounter  Medications  . brimonidine (ALPHAGAN) 0.2 % ophthalmic solution    Sig: Apply to eye 2 (two) times daily.    Junious Silk, MD, [facility] [phone] office [phone] pager

## 2012-11-26 ENCOUNTER — Telehealth: Payer: Self-pay | Admitting: *Deleted

## 2012-11-26 NOTE — Telephone Encounter (Signed)
Message copied by Vita Barley on Thu Nov 26, 2012  1:10 PM ------      Message from: Thurmon Fair      Created: Tue Nov 24, 2012 10:27 PM       Please tell them I have reviewed some older ECG and I think she should stop the digoxin - has some signs of AV conduction disease progression. Promptly call us if she has syncope/presyncope.      Is she going to have echo? ------

## 2012-11-26 NOTE — Telephone Encounter (Signed)
Daughter notified to stop digoxin.  Appt. For echo is scheduled 12/09/12.

## 2012-12-01 ENCOUNTER — Encounter: Payer: Self-pay | Admitting: Cardiovascular Disease

## 2012-12-04 ENCOUNTER — Encounter (HOSPITAL_COMMUNITY): Payer: Self-pay | Admitting: Emergency Medicine

## 2012-12-04 ENCOUNTER — Observation Stay (HOSPITAL_COMMUNITY)
Admission: EM | Admit: 2012-12-04 | Discharge: 2012-12-05 | Disposition: A | Discharge status: Home / Self Care | Payer: Medicare Other | Attending: Cardiovascular Disease | Admitting: Cardiovascular Disease

## 2012-12-04 ENCOUNTER — Emergency Department (HOSPITAL_COMMUNITY): Payer: Medicare Other

## 2012-12-04 DIAGNOSIS — E876 Hypokalemia: Secondary | ICD-10-CM

## 2012-12-04 DIAGNOSIS — Z79899 Other long term (current) drug therapy: Secondary | ICD-10-CM | POA: Insufficient documentation

## 2012-12-04 DIAGNOSIS — R011 Cardiac murmur, unspecified: Secondary | ICD-10-CM | POA: Insufficient documentation

## 2012-12-04 DIAGNOSIS — I4729 Other ventricular tachycardia: Secondary | ICD-10-CM

## 2012-12-04 DIAGNOSIS — I472 Ventricular tachycardia, unspecified: Secondary | ICD-10-CM | POA: Insufficient documentation

## 2012-12-04 DIAGNOSIS — E7800 Pure hypercholesterolemia, unspecified: Secondary | ICD-10-CM | POA: Diagnosis not present

## 2012-12-04 DIAGNOSIS — R55 Syncope and collapse: Secondary | ICD-10-CM | POA: Diagnosis not present

## 2012-12-04 DIAGNOSIS — I455 Other specified heart block: Principal | ICD-10-CM | POA: Insufficient documentation

## 2012-12-04 DIAGNOSIS — R079 Chest pain, unspecified: Secondary | ICD-10-CM | POA: Diagnosis present

## 2012-12-04 DIAGNOSIS — R5381 Other malaise: Secondary | ICD-10-CM | POA: Diagnosis not present

## 2012-12-04 DIAGNOSIS — Z7902 Long term (current) use of antithrombotics/antiplatelets: Secondary | ICD-10-CM | POA: Insufficient documentation

## 2012-12-04 DIAGNOSIS — I454 Nonspecific intraventricular block: Secondary | ICD-10-CM

## 2012-12-04 DIAGNOSIS — I10 Essential (primary) hypertension: Secondary | ICD-10-CM

## 2012-12-04 DIAGNOSIS — I2510 Atherosclerotic heart disease of native coronary artery without angina pectoris: Secondary | ICD-10-CM | POA: Diagnosis not present

## 2012-12-04 DIAGNOSIS — R000 Tachycardia, unspecified: Secondary | ICD-10-CM | POA: Diagnosis not present

## 2012-12-04 DIAGNOSIS — E785 Hyperlipidemia, unspecified: Secondary | ICD-10-CM | POA: Diagnosis present

## 2012-12-04 DIAGNOSIS — I119 Hypertensive heart disease without heart failure: Secondary | ICD-10-CM | POA: Diagnosis present

## 2012-12-04 HISTORY — DX: Transient cerebral ischemic attack, unspecified: G45.9

## 2012-12-04 HISTORY — DX: Pure hypercholesterolemia, unspecified: E78.00

## 2012-12-04 HISTORY — DX: Peripheral vascular disease, unspecified: I73.9

## 2012-12-04 HISTORY — DX: Tachycardia, unspecified: R00.0

## 2012-12-04 HISTORY — DX: Unspecified osteoarthritis, unspecified site: M19.90

## 2012-12-04 HISTORY — DX: Shortness of breath: R06.02

## 2012-12-04 HISTORY — DX: Acute embolism and thrombosis of unspecified deep veins of unspecified lower extremity: I82.409

## 2012-12-04 HISTORY — DX: Hypertensive heart disease without heart failure: I11.9

## 2012-12-04 LAB — POCT I-STAT TROPONIN I: Troponin i, poc: 0.01 ng/mL (ref 0.00–0.08)

## 2012-12-04 LAB — CBC
MCH: 30.5 pg (ref 26.0–34.0)
MCHC: 34.3 g/dL (ref 30.0–36.0)
MCV: 88.7 fL (ref 78.0–100.0)
Platelets: 220 10*3/uL (ref 150–400)
RDW: 13.2 % (ref 11.5–15.5)
WBC: 6.2 10*3/uL (ref 4.0–10.5)

## 2012-12-04 LAB — COMPREHENSIVE METABOLIC PANEL
ALT: 9 U/L (ref 0–35)
Albumin: 3.1 g/dL — ABNORMAL LOW (ref 3.5–5.2)
Alkaline Phosphatase: 47 U/L (ref 39–117)
Glucose, Bld: 137 mg/dL — ABNORMAL HIGH (ref 70–99)
Potassium: 3.2 mEq/L — ABNORMAL LOW (ref 3.5–5.1)
Sodium: 135 mEq/L (ref 135–145)
Total Protein: 7 g/dL (ref 6.0–8.3)

## 2012-12-04 LAB — CBC WITH DIFFERENTIAL/PLATELET
Basophils Relative: 0 % (ref 0–1)
Eosinophils Absolute: 0.2 10*3/uL (ref 0.0–0.7)
Lymphs Abs: 1.5 10*3/uL (ref 0.7–4.0)
MCH: 31.1 pg (ref 26.0–34.0)
Neutro Abs: 5 10*3/uL (ref 1.7–7.7)
Neutrophils Relative %: 68 % (ref 43–77)
Platelets: 236 10*3/uL (ref 150–400)
RBC: 4.08 MIL/uL (ref 3.87–5.11)

## 2012-12-04 LAB — CREATININE, SERUM
Creatinine, Ser: 0.79 mg/dL (ref 0.50–1.10)
GFR calc non Af Amer: 68 mL/min — ABNORMAL LOW (ref >90)

## 2012-12-04 MED ORDER — POTASSIUM CHLORIDE CRYS ER 20 MEQ PO TBCR
40.0000 meq | EXTENDED_RELEASE_TABLET | Freq: Once | ORAL | Status: AC
  Administered 2012-12-04: 40 meq via ORAL
  Filled 2012-12-04: qty 2

## 2012-12-04 MED ORDER — ASPIRIN EC 81 MG PO TBEC: 81.0000 mg | DELAYED_RELEASE_TABLET | Freq: Every day | ORAL | Status: DC

## 2012-12-04 MED ORDER — ONDANSETRON HCL 4 MG PO TABS: 4.0000 mg | ORAL_TABLET | Freq: Four times a day (QID) | ORAL | Status: DC | PRN

## 2012-12-04 MED ORDER — ALUM & MAG HYDROXIDE-SIMETH 200-200-20 MG/5ML PO SUSP: 30.0000 mL | Freq: Four times a day (QID) | ORAL | Status: DC | PRN

## 2012-12-04 MED ORDER — DORZOLAMIDE HCL-TIMOLOL MAL 2-0.5 % OP SOLN
1.0000 [drp] | Freq: Two times a day (BID) | OPHTHALMIC | Status: DC
  Administered 2012-12-04 – 2012-12-05 (×2): 1 [drp] via OPHTHALMIC
  Filled 2012-12-04: qty 10

## 2012-12-04 MED ORDER — POLYETHYLENE GLYCOL 3350 17 G PO PACK
17.0000 g | PACK | Freq: Every day | ORAL | Status: DC | PRN
  Filled 2012-12-04: qty 1

## 2012-12-04 MED ORDER — SODIUM CHLORIDE 0.9 % IV SOLN
Freq: Once | INTRAVENOUS | Status: AC
  Administered 2012-12-04: 11:00:00 via INTRAVENOUS

## 2012-12-04 MED ORDER — CLOPIDOGREL BISULFATE 75 MG PO TABS
75.0000 mg | ORAL_TABLET | Freq: Every day | ORAL | Status: DC
  Administered 2012-12-04 – 2012-12-05 (×2): 75 mg via ORAL
  Filled 2012-12-04 (×2): qty 1

## 2012-12-04 MED ORDER — ONDANSETRON HCL 4 MG/2ML IJ SOLN: 4.0000 mg | Freq: Four times a day (QID) | INTRAMUSCULAR | Status: DC | PRN

## 2012-12-04 MED ORDER — DOCUSATE SODIUM 100 MG PO CAPS
100.0000 mg | ORAL_CAPSULE | Freq: Two times a day (BID) | ORAL | Status: DC
  Administered 2012-12-04 – 2012-12-05 (×2): 100 mg via ORAL
  Filled 2012-12-04 (×3): qty 1

## 2012-12-04 MED ORDER — ACETAMINOPHEN 650 MG RE SUPP: 650.0000 mg | Freq: Four times a day (QID) | RECTAL | Status: DC | PRN

## 2012-12-04 MED ORDER — ATORVASTATIN CALCIUM 10 MG PO TABS
10.0000 mg | ORAL_TABLET | Freq: Every day | ORAL | Status: DC
  Administered 2012-12-04: 10 mg via ORAL
  Filled 2012-12-04 (×2): qty 1

## 2012-12-04 MED ORDER — HYDROCHLOROTHIAZIDE 12.5 MG PO CAPS
12.5000 mg | ORAL_CAPSULE | Freq: Every day | ORAL | Status: DC
  Administered 2012-12-04: 12.5 mg via ORAL
  Filled 2012-12-04: qty 1

## 2012-12-04 MED ORDER — SODIUM CHLORIDE 0.9 % IV SOLN: 250.0000 mL | INTRAVENOUS | Status: DC | PRN

## 2012-12-04 MED ORDER — HEPARIN SODIUM (PORCINE) 5000 UNIT/ML IJ SOLN
5000.0000 [IU] | Freq: Three times a day (TID) | INTRAMUSCULAR | Status: DC
  Administered 2012-12-04 – 2012-12-05 (×3): 5000 [IU] via SUBCUTANEOUS
  Filled 2012-12-04 (×6): qty 1

## 2012-12-04 MED ORDER — BRIMONIDINE TARTRATE 0.2 % OP SOLN
1.0000 [drp] | Freq: Two times a day (BID) | OPHTHALMIC | Status: DC
  Administered 2012-12-04 – 2012-12-05 (×2): 1 [drp] via OPHTHALMIC
  Filled 2012-12-04: qty 5

## 2012-12-04 MED ORDER — ATENOLOL 25 MG PO TABS
25.0000 mg | ORAL_TABLET | Freq: Every day | ORAL | Status: DC
  Administered 2012-12-04 – 2012-12-05 (×2): 25 mg via ORAL
  Filled 2012-12-04 (×2): qty 1

## 2012-12-04 MED ORDER — HYDROCHLOROTHIAZIDE 25 MG PO TABS
12.5000 mg | ORAL_TABLET | Freq: Every day | ORAL | Status: DC
  Filled 2012-12-04: qty 0.5

## 2012-12-04 MED ORDER — ASPIRIN EC 81 MG PO TBEC
81.0000 mg | DELAYED_RELEASE_TABLET | Freq: Every day | ORAL | Status: DC
  Administered 2012-12-04 – 2012-12-05 (×2): 81 mg via ORAL
  Filled 2012-12-04 (×2): qty 1

## 2012-12-04 MED ORDER — ACETAMINOPHEN 325 MG PO TABS: 650.0000 mg | ORAL_TABLET | Freq: Four times a day (QID) | ORAL | Status: DC | PRN

## 2012-12-04 MED ORDER — SODIUM CHLORIDE 0.9 % IJ SOLN: 3.0000 mL | Freq: Two times a day (BID) | INTRAMUSCULAR | Status: DC

## 2012-12-04 MED ORDER — SODIUM CHLORIDE 0.9 % IJ SOLN: 3.0000 mL | INTRAMUSCULAR | Status: DC | PRN

## 2012-12-04 MED ORDER — SODIUM CHLORIDE 0.9 % IJ SOLN
3.0000 mL | Freq: Two times a day (BID) | INTRAMUSCULAR | Status: DC
  Administered 2012-12-04: 3 mL via INTRAVENOUS

## 2012-12-04 MED ORDER — RISEDRONATE SODIUM 5 MG PO TABS: 35.0000 mg | ORAL_TABLET | ORAL | Status: DC

## 2012-12-04 MED ORDER — SIMVASTATIN 20 MG PO TABS: 20.0000 mg | ORAL_TABLET | Freq: Every day | ORAL | Status: DC

## 2012-12-04 MED ORDER — VERAPAMIL HCL ER 180 MG PO TBCR
180.0000 mg | EXTENDED_RELEASE_TABLET | Freq: Every day | ORAL | Status: DC
  Administered 2012-12-04 – 2012-12-05 (×2): 180 mg via ORAL
  Filled 2012-12-04 (×2): qty 1

## 2012-12-04 MED ORDER — CALCIUM CARBONATE 1250 (500 CA) MG PO TABS
2.0000 | ORAL_TABLET | Freq: Every day | ORAL | Status: DC
  Administered 2012-12-05: 1000 mg via ORAL
  Filled 2012-12-04 (×2): qty 2

## 2012-12-04 NOTE — ED Notes (Signed)
Cards into see pt

## 2012-12-04 NOTE — ED Notes (Signed)
Dr C to bedside

## 2012-12-04 NOTE — ED Notes (Signed)
Heart Healthy diet ordered.  

## 2012-12-04 NOTE — ED Provider Notes (Signed)
CSN: [id number]     Arrival date & time 12/04/12  4098 History   First MD Initiated Contact with Patient 12/04/12 [phone]     Chief Complaint  Patient presents with  . Chest Pain   (Consider location/radiation/quality/duration/timing/severity/associated sxs/prior Treatment) Patient is a 79 y.o. female presenting with weakness. The history is provided by the patient and a relative. No language interpreter was used.  Weakness This is a new problem. The current episode started in the past 7 days. The problem has been unchanged. Associated symptoms include weakness. Pertinent negatives include no fever, nausea, numbness or vomiting. The symptoms are aggravated by walking. She has tried lying down for the symptoms. The treatment provided moderate relief.   Patient is a 79 year old female patient, accompanied by her daughters for today's visit. She reports weakness and dizziness after having a bath last night. Her daughter reports that she may have been having chest pain last night due to the fact that she was rubbing her chest. She reports that she was feeling better after lying down for some time last night. She denies shortness of breath, chest pain and dizziness this morning. She reports that she is feeling weak all over. She denies any history of recent illness, fever, chills, nausea, vomiting, or diarrhea. She reports normal bowel movements and urination.   Past Medical History  Diagnosis Date  . Heart murmur   . Coronary artery disease   . Hypertension   . High cholesterol    History reviewed. No pertinent past surgical history. No family history on file. History  Substance Use Topics  . Smoking status: Never Smoker   . Smokeless tobacco: Not on file  . Alcohol Use: No   OB History   Grav Para Term Preterm Abortions TAB SAB Ect Mult Living                 Review of Systems  Constitutional: Negative for fever.  Respiratory: Negative for chest tightness, shortness of breath and  wheezing.   Cardiovascular: Negative for leg swelling.  Gastrointestinal: Negative for nausea, vomiting and diarrhea.  Neurological: Positive for weakness. Negative for syncope, speech difficulty, light-headedness and numbness.  All other systems reviewed and are negative.    Allergies  Review of patient's allergies indicates no known allergies.  Home Medications   Current Outpatient Rx  Name  Route  Sig  Dispense  Refill  . atenolol (TENORMIN) 50 MG tablet   Oral   Take 25 mg by mouth daily.         . brimonidine (ALPHAGAN) 0.2 % ophthalmic solution   Ophthalmic   Apply to eye 2 (two) times daily.         . clopidogrel (PLAVIX) 75 MG tablet   Oral   Take 75 mg by mouth daily.         . digoxin (LANOXIN) 0.125 MG tablet   Oral   Take 0.125 mg by mouth daily.         . dorzolamide-timolol (COSOPT) 22.3-6.8 MG/ML ophthalmic solution   Both Eyes   Place 1 drop into both eyes 2 (two) times daily.         . hydrochlorothiazide (HYDRODIURIL) 25 MG tablet   Oral   Take 25 mg by mouth daily.         . pravastatin (PRAVACHOL) 40 MG tablet   Oral   Take 40 mg by mouth daily.         . risedronate (ACTONEL) 35 MG tablet  Oral   Take 35 mg by mouth every 7 (seven) days. with water on empty stomach, nothing by mouth or lie down for next 30 minutes.         . verapamil (VERELAN PM) 180 MG 24 hr capsule   Oral   Take 180 mg by mouth daily.          BP 125/49  Pulse 64  Temp(Src) 97.4 F (36.3 C) (Oral)  Resp 18  SpO2 95% Physical Exam  Constitutional: She is oriented to person, place, and time. She appears well-developed. No distress.  HENT:  Head: Normocephalic and atraumatic.  Right Ear: External ear normal.  Left Ear: External ear normal.  Mouth/Throat: Oropharynx is clear and moist.  Eyes: Pupils are equal, round, and reactive to light.  Neck: Normal range of motion. Neck supple.  Cardiovascular: Normal rate, regular rhythm, normal heart  sounds and intact distal pulses.  Exam reveals no gallop and no friction rub.   No murmur heard. Pulmonary/Chest: Effort normal and breath sounds normal. She has no wheezes. She has no rales.  Abdominal: Soft. Bowel sounds are normal.  Musculoskeletal: Normal range of motion.  Neurological: She is alert and oriented to person, place, and time. She has normal strength. No cranial nerve deficit or sensory deficit. GCS eye subscore is 4. GCS verbal subscore is 5. GCS motor subscore is 6.  Skin: Skin is warm and dry.  Psychiatric: She has a normal mood and affect. Her behavior is normal. Judgment and thought content normal.    ED Course  Procedures (including critical care time) Labs Review Labs Reviewed - No data to display Imaging Review No results found.  Date: 12/04/2012  Rate: 61  Rhythm: normal sinus rhythm  QRS Axis: normal  Intervals: normal  ST/T Wave abnormalities: ST depressions inferiorly  Conduction Disutrbances:nonspecific intraventricular conduction delay  Narrative Interpretation: Normal Sinus Rhythm  Old EKG Reviewed: changes noted and ST changes, T-wave flattened Reviewed by Dr. Radford Pax.  EKG Interpretation   None       MDM   1. IVCD (intraventricular conduction defect)   2. Syncope, near     The seen in the emergency department by [facility] cardiology, will admit. CBC, PT, INR unremarkable. Metabolic panel significant for low potassium, 3.2. Troponin 0.01. Chest x-ray unchanged from previous. Hemodynamically stable for time in ER, awake, alert and oriented. Pt and family agree with plan.       Irish Elders, NP 12/04/12 1329  Irish Elders, NP 12/04/12 1501

## 2012-12-04 NOTE — Progress Notes (Signed)
Pt had 18 beats of vtach, asymptomatic. Dr. Salena Saner notified, will cont to monitor

## 2012-12-04 NOTE — ED Notes (Signed)
Cp started yesterday and disoriented not better today weaker. Was taken off heart meds digoxin last week declining every since [facility]

## 2012-12-04 NOTE — Progress Notes (Signed)

## 2012-12-04 NOTE — H&P (Signed)
Pamela Hayes is an 79 y.o. female.    Primary Cardiologist:  Dr. Erica Richwine  ZOX:[id number],BJYNW, MD  Chief Complaint: weakness "feel like I am fading away" HPI:  79 YEAR old AAF that does not look her stated age.   She has has HTN for well over 30 years, generally believed to be well treated. She also has hyperlipidemia and glaucoma, but otherwise is quite healthy. She is a little frail recently, but still participates in many activities. She has no history of stroke, MI, CHF or i\other serious cardiovascular problems. She does not have any angina or dyspnea at rest, but becomes occasionally breathless with activity and at times has orthostatic dizziness. She has been on plavix for years and her daughters think it was prescribed for a "minstroke".   Her digoxin was stopped on her last office visit.  Over the last 2 weeks she has felt as if she will "fade away" at times.  She is usually sitting when this happens.  Her family could not state that she passes out, they state she becomes confused.  She stated she would no where she was but just very weak.  Last pm she sat down and then fell/laid across the bed.  She denies chest pain but her family stated she would hold her chest.  No obvious SOB.    Here in ER, EKG is similar to office EKG  SR 1st degree AVB with PR 208 ms; QRS 122 ms;  prominent ST depression in I, aVL and inverted T waves in almost all leads.  She is only complaining of pain in the rt ankle/leg.  She tells me it is rheumatism.  Labs reveal hypokalemia, negative Troponin and otherwise normal labs.  No chest pain.   Past Medical History  Diagnosis Date  . Heart murmur   . Coronary artery disease   . Hypertension   . High cholesterol   . LVH (left ventricular hypertrophy) due to hypertensive disease     History reviewed. No pertinent past surgical history.  Family History  Problem Relation Age of Onset  . Stroke Mother   . Heart disease Father    Social  History:  reports that she has never smoked. She has never used smokeless tobacco. She reports that she does not drink alcohol or use illicit drugs.  Allergies: No Known Allergies  Out Patient Medications: Current Outpatient Prescriptions  Medication  Sig  Dispense  Refill  .  atenolol (TENORMIN) 50 MG tablet  Take 25 mg by mouth daily.  .  brimonidine (ALPHAGAN) 0.2 % ophthalmic solution  Apply to eye 2 (two) times daily.  .  clopidogrel (PLAVIX) 75 MG tablet  Take 75 mg by mouth daily.  .   .  dorzolamide-timolol (COSOPT) 22.3-6.8 MG/ML ophthalmic solution  Place 1 drop into both eyes 2 (two) times daily.  .  hydrochlorothiazide (HYDRODIURIL) 25 MG tablet  Take 25 mg by mouth daily.  .  pravastatin (PRAVACHOL) 40 MG tablet  Take 40 mg by mouth daily.  .  risedronate (ACTONEL) 35 MG tablet  Take 35 mg by mouth every 7 (seven) days. with water on empty stomach, nothing by mouth or lie down for next 30 minutes.  .  verapamil (VERELAN PM) 180 MG 24 hr capsule   Results for orders placed during the hospital encounter of 12/04/12 (from the past 48 hour(s))  CBC WITH DIFFERENTIAL     Status: None   Collection Time  12/04/12  9:24 AM      Result Value Range   WBC 7.3  4.0 - 10.5 K/uL   RBC 4.08  3.87 - 5.11 MIL/uL   Hemoglobin 12.7  12.0 - 15.0 g/dL   HCT 95.6  21.3 - 08.6 %   MCV 88.7  78.0 - 100.0 fL   MCH 31.1  26.0 - 34.0 pg   MCHC 35.1  30.0 - 36.0 g/dL   RDW 57.8  46.9 - 62.9 %   Platelets 236  150 - 400 K/uL   Neutrophils Relative % 68  43 - 77 %   Neutro Abs 5.0  1.7 - 7.7 K/uL   Lymphocytes Relative 20  12 - 46 %   Lymphs Abs 1.5  0.7 - 4.0 K/uL   Monocytes Relative 10  3 - 12 %   Monocytes Absolute 0.7  0.1 - 1.0 K/uL   Eosinophils Relative 2  0 - 5 %   Eosinophils Absolute 0.2  0.0 - 0.7 K/uL   Basophils Relative 0  0 - 1 %   Basophils Absolute 0.0  0.0 - 0.1 K/uL  COMPREHENSIVE METABOLIC PANEL     Status: Abnormal   Collection Time    12/04/12   9:24 AM      Result Value Range   Sodium 135  135 - 145 mEq/L   Potassium 3.2 (*) 3.5 - 5.1 mEq/L   Chloride 99  96 - 112 mEq/L   CO2 25  19 - 32 mEq/L   Glucose, Bld 137 (*) 70 - 99 mg/dL   BUN 12  6 - 23 mg/dL   Creatinine, Ser 5.28  0.50 - 1.10 mg/dL   Calcium 8.8  8.4 - 41.3 mg/dL   Total Protein 7.0  6.0 - 8.3 g/dL   Albumin 3.1 (*) 3.5 - 5.2 g/dL   AST 15  0 - 37 U/L   ALT 9  0 - 35 U/L   Alkaline Phosphatase 47  39 - 117 U/L   Total Bilirubin 0.3  0.3 - 1.2 mg/dL   GFR calc non Af Amer 60 (*) >90 mL/min   GFR calc Af Amer 70 (*) >90 mL/min   Comment: (NOTE)     The eGFR has been calculated using the CKD EPI equation.     This calculation has not been validated in all clinical situations.     eGFR's persistently <90 mL/min signify possible Chronic Kidney     Disease.  PROTIME-INR     Status: None   Collection Time    12/04/12  9:24 AM      Result Value Range   Prothrombin Time 12.9  11.6 - 15.2 seconds   INR 0.99  0.00 - 1.49  POCT I-STAT TROPONIN I     Status: None   Collection Time    12/04/12  9:30 AM      Result Value Range   Troponin i, poc 0.01  0.00 - 0.08 ng/mL   Comment 3            Comment: Due to the release kinetics of cTnI,     a negative result within the first hours     of the onset of symptoms does not rule out     myocardial infarction with certainty.     If myocardial infarction is still suspected,     repeat the test at appropriate intervals.   Dg Chest 2 View  12/04/2012   CLINICAL DATA:  Chest  pain. Coronary artery disease and hypertension.  EXAM: CHEST  2 VIEW  COMPARISON:  None.  FINDINGS: Cardiomegaly is unchanged. Marked tortuosity and uncoiling of the thoracic aorta remains present without interval change compared to prior exam. There is bilateral basilar atelectasis, greater on the left than right. No focal consolidation is identified. There is no pulmonary edema. Allowing for lower lung volumes on today's examination and basilar  atelectasis, there is no interval change compared to priors.  IMPRESSION: Suboptimal inspiration with lower volumes than on prior. Cardiomegaly and unchanged tortuosity of the thoracic aorta with basilar atelectasis.   Electronically Signed   By: Andreas Newport M.D.   On: 12/04/2012 10:27    ROS: General:no colds or fevers, no cough, no weight changes Skin:no rashes or ulcers HEENT:no blurred vision, no congestion CV:see HPI PUL:see HPI GI:no diarrhea constipation - unless she misses her metamucil, no melena, no indigestion GU:no hematuria, no dysuria MS:+ rt ankle pain, no claudication Neuro:near syncope, no lightheadedness Endo:no diabetes, no thyroid disease   Blood pressure 135/61, pulse 69, temperature 97.4 F (36.3 C), temperature source Oral, resp. rate 27, SpO2 98.00%. PE: General:Pleasant affect, NAD Skin:Warm and dry, brisk capillary refill HEENT:normocephalic, sclera clear, mucus membranes moist, crusting on eyelashes Neck:supple, no JVD, no bruits  Heart:S1S2 RRR without murmur, gallup, rub or click Lungs:clear without rales, rhonchi, or wheezes ZOX:WRUE, non tender, + BS, do not palpate liver spleen or masses Ext:tr edema of ankles otherwise no lower ext edema, 1+ pedal pulses, 2+ radial pulses Neuro:alert and oriented, MAE, follows commands, + facial symmetry    Assessment/Plan Principal Problem:   Chest pain, vague pain, more holding her chest Active Problems:   Syncope, near- to possible syncope   Plan:  Pt has echo ordered for 12/09/12, ? Admit ? TIAs, check orthostatic BPs.  Check Echo here.  Past hospitalizations pt was DNR this will need to be addressed.   [facility] R Nurse Practitioner Certified [facility] Pager [phone] 12/04/2012, 11:52 AM    I have seen and examined the patient along with Nada Boozer, NP.  I have reviewed the chart, notes and new data.  I agree with NP's note.  Key new complaints: symptoms suggest  recurrent presyncope Key examination changes: no sign of CHF, no arrhythmia since arrival Key new findings / data: nonspecific IVCD and first degree AV block, unchanged from before, possible ischemic changes  PLAN: I worry about transient bradycardia/pauses in this 79 yo with evidence of AV conduction disease who is taking a beta blocker and verapamil. None documented so far. Recommend she consider overnight telemetry. Also would be able to do a quicker echo (scheduled for 10/15 as an outpatient).  She is reluctant, although family is pushing her to consider overnight observation. Replace K, reduce HCTZ dose. She clearly does not want invasive procedures so evaluation for possible ischemic heart disease would likely not change her management.  Thurmon Fair, MD, [facility] [phone] 12/04/2012, 12:38 PM

## 2012-12-05 ENCOUNTER — Encounter (HOSPITAL_COMMUNITY): Payer: Self-pay | Admitting: Cardiology

## 2012-12-05 ENCOUNTER — Other Ambulatory Visit: Payer: Self-pay | Admitting: Cardiology

## 2012-12-05 DIAGNOSIS — R000 Tachycardia, unspecified: Secondary | ICD-10-CM

## 2012-12-05 DIAGNOSIS — R55 Syncope and collapse: Secondary | ICD-10-CM

## 2012-12-05 DIAGNOSIS — E876 Hypokalemia: Secondary | ICD-10-CM

## 2012-12-05 DIAGNOSIS — I10 Essential (primary) hypertension: Secondary | ICD-10-CM

## 2012-12-05 DIAGNOSIS — I455 Other specified heart block: Secondary | ICD-10-CM | POA: Diagnosis not present

## 2012-12-05 DIAGNOSIS — I359 Nonrheumatic aortic valve disorder, unspecified: Secondary | ICD-10-CM

## 2012-12-05 DIAGNOSIS — I472 Ventricular tachycardia: Secondary | ICD-10-CM | POA: Diagnosis not present

## 2012-12-05 HISTORY — DX: Tachycardia, unspecified: R00.0

## 2012-12-05 LAB — BASIC METABOLIC PANEL
BUN: 11 mg/dL (ref 6–23)
CO2: 23 mEq/L (ref 19–32)
Calcium: 9 mg/dL (ref 8.4–10.5)
Chloride: 102 mEq/L (ref 96–112)
Creatinine, Ser: 0.78 mg/dL (ref 0.50–1.10)
Glucose, Bld: 84 mg/dL (ref 70–99)

## 2012-12-05 LAB — CBC
HCT: 36.1 % (ref 36.0–46.0)
MCH: 30.3 pg (ref 26.0–34.0)
MCV: 88.9 fL (ref 78.0–100.0)
RDW: 13.2 % (ref 11.5–15.5)
WBC: 6.6 10*3/uL (ref 4.0–10.5)

## 2012-12-05 LAB — MAGNESIUM: Magnesium: 1.9 mg/dL (ref 1.5–2.5)

## 2012-12-05 MED ORDER — POLYETHYLENE GLYCOL 3350 17 G PO PACK: 17.0000 g | PACK | Freq: Every day | ORAL | Status: AC | PRN

## 2012-12-05 NOTE — Progress Notes (Signed)
Echocardiogram 2D Echocardiogram has been performed.  Deaunna Olarte 12/05/2012, 2:02 PM

## 2012-12-05 NOTE — Progress Notes (Signed)
Subjective: No episodes of fading away since admit. Noted PVCs, and ST rate of 100   Objective: Vital signs in last 24 hours: Temp:  [98.1 F (36.7 C)-98.3 F (36.8 C)] 98.3 F (36.8 C) (10/11 0534) Pulse Rate:  [61-86] 67 (10/11 0534) Resp:  [16-24] 18 (10/11 0534) BP: (113-169)/(52-81) 151/68 mmHg (10/11 0534) SpO2:  [94 %-100 %] 100 % (10/11 0534) Weight:  [104 lb 14.4 oz (47.582 kg)-109 lb 12.8 oz (49.805 kg)] 104 lb 14.4 oz (47.582 kg) (10/11 0110) Weight change:  Last BM Date: 12/02/12 Intake/Output from previous day:   Intake/Output this shift:    PE: General:Pleasant affect, NAD Skin:Warm and dry, brisk capillary refill HEENT:normocephalic, sclera clear, mucus membranes moist Heart:S1S2 RRR with soft systolic murmur, no gallup, rub or click Lungs:clear without rales, rhonchi, or wheezes ZOX:WRUE, non tender, + BS, do not palpate liver spleen or masses Ext:no lower ext edema,  Neuro:alert and oriented, MAE, follows commands, + facial symmetry   Lab Results:  Recent Labs  12/04/12 1548 12/05/12 0500  WBC 6.2 6.6  HGB 11.3* 12.3  HCT 32.9* 36.1  PLT 220 227   BMET  Recent Labs  12/04/12 0924 12/04/12 1548 12/05/12 0500  NA 135  --  134*  K 3.2*  --  4.5  CL 99  --  102  CO2 25  --  23  GLUCOSE 137*  --  84  BUN 12  --  11  CREATININE 0.80 0.79 0.78  CALCIUM 8.8  --  9.0    Recent Labs  12/04/12 1548  TROPONINI <0.30      Hepatic Function Panel  Recent Labs  12/04/12 0924  PROT 7.0  ALBUMIN 3.1*  AST 15  ALT 9  ALKPHOS 47  BILITOT 0.3    Studies/Results: Dg Chest 2 View  12/04/2012   CLINICAL DATA:  Chest pain. Coronary artery disease and hypertension.  EXAM: CHEST  2 VIEW  COMPARISON:  None.  FINDINGS: Cardiomegaly is unchanged. Marked tortuosity and uncoiling of the thoracic aorta remains present without interval change compared to prior exam. There is bilateral basilar atelectasis, greater on the left than right. No focal  consolidation is identified. There is no pulmonary edema. Allowing for lower lung volumes on today's examination and basilar atelectasis, there is no interval change compared to priors.  IMPRESSION: Suboptimal inspiration with lower volumes than on prior. Cardiomegaly and unchanged tortuosity of the thoracic aorta with basilar atelectasis.   Electronically Signed   By: Andreas Newport M.D.   On: 12/04/2012 10:27    Medications: I have reviewed the patient's current medications. Scheduled Meds: . aspirin EC  81 mg Oral Daily  . atenolol  25 mg Oral Daily  . atorvastatin  10 mg Oral q1800  . brimonidine  1 drop Both Eyes BID  . calcium carbonate  2 tablet Oral Q breakfast  . clopidogrel  75 mg Oral Daily  . docusate sodium  100 mg Oral BID  . dorzolamide-timolol  1 drop Both Eyes BID  . heparin  5,000 Units Subcutaneous Q8H  . sodium chloride  3 mL Intravenous Q12H  . sodium chloride  3 mL Intravenous Q12H  . verapamil  180 mg Oral Daily   Continuous Infusions:  PRN Meds:.sodium chloride, acetaminophen, acetaminophen, alum & mag hydroxide-simeth, ondansetron (ZOFRAN) IV, ondansetron, polyethylene glycol, sodium chloride  Assessment/Plan: Principal Problem:   Chest pain, vague pain, more holding her chest Active Problems:   HTN (hypertension)   Hyperlipidemia  LVH (left ventricular hypertrophy) due to hypertensive disease   Syncope, near- to possible syncope    NSVT (nonsustained ventricular tachycardia)  PLAN:  Negative troponin, K+ improved.  Mg 1.9 No episodes of fading away since admit.  ? Discharge.  Hydrodiuril was stopped.     LOS: 1 day   Time spent with pt. : 15 minutes. [facility] R  Nurse Practitioner Certified Pager [phone] 12/05/2012, 9:18 AM    I have seen and examined the patient along with Nada Boozer, NP.  I have reviewed the chart, notes and new data.  I agree with NP's note.  Key new complaints: feels well, no episodes of presyncope Key examination  changes: no signs of CHF Key new findings / data: echo shows normal LVEF and no serious valvular abnormalities, cannot exclude a small apical wall motion abnormality Monitor shows PVCs and very brief SVT (5-7 beats). Reported VT from yesterday afternoon is not confirmed on my review of the telemetry data and no printed documentation is found  PLAN: DC home. Stop HCTZ. Outpatient 14 day event monitor.  Thurmon Fair, MD, [facility] [phone] 12/05/2012, 12:04 PM

## 2012-12-05 NOTE — Discharge Summary (Signed)
Physician Discharge Summary       Patient ID: Pamela Hayes MRN: [id number] DOB/AGE: 08-31-1914 79 y.o.  Admit date: 12/04/2012 Discharge date: 12/05/2012  Discharge Diagnoses:  Principal Problem:   Chest pain, vague pain, more holding her chest, negative MI Active Problems:   Syncope, near- to possible syncope    NSVT (nonsustained ventricular tachycardia)   Hypokalemia, replaced   Tachycardia   HTN (hypertension)   Hyperlipidemia   LVH (left ventricular hypertrophy) due to hypertensive disease   Discharged Condition: good, no further episodes in hospital  Procedures: none  Hospital Course:  79 YEAR old AAF that does not look her stated age. She has had HTN for well over 30 years, generally believed to be well treated. She also has hyperlipidemia and glaucoma, but otherwise is quite healthy. She is a little frail recently, but still participates in many activities. She has no history of stroke, MI, CHF or i\other serious cardiovascular problems. She does not have any angina or dyspnea at rest, but becomes occasionally breathless with activity and at times has orthostatic dizziness. She has been on plavix for years and her daughters think it was prescribed for a "minstroke". Her digoxin was stopped on her last office visit. Over the last 2 weeks she has felt as if she will "fade away" at times. She is usually sitting when this happens. Her family could not state that she passes out, they state she becomes confused. She stated she would no where she was but just very weak. Last pm she sat down and then fell/laid across the bed. She denies chest pain but her family stated she would hold her chest. No obvious SOB.  In ER, EKG was similar to office EKG SR 1st degree AVB with PR 208 ms; QRS 122 ms; prominent ST depression in I, aVL and inverted T waves in almost all leads.  She is only complaining of pain in the rt ankle/leg. She tells me it is rheumatism. Labs reveal hypokalemia, negative  Troponin and otherwise normal labs. No chest pain.   Pt was kept as OBS pt overnight.  It was reported she had 18 beats of NSVT.  She also has short bursts of sinus tach.  She has not had any symptoms of fading away.  Echo was done and normal, with possible small apical wall motion abnormality.  She was seen and evaluated by Dr. Royann Shivers and felt ready for discharge.  Troponins were negative.  We will arrange outpatient event monitor for 2 weeks.  She will follow up with Dr. Royann Shivers.  Hydrodiuril was stopped due to hypokalemia.   Consults: None  Significant Diagnostic Studies:  BMET    Component Value Date/Time   NA 134* 12/05/2012 0500   K 4.5 12/05/2012 0500   CL 102 12/05/2012 0500   CO2 23 12/05/2012 0500   GLUCOSE 84 12/05/2012 0500   BUN 11 12/05/2012 0500   CREATININE 0.78 12/05/2012 0500   CALCIUM 9.0 12/05/2012 0500   GFRNONAA 68* 12/05/2012 0500   GFRAA 79* 12/05/2012 0500    CBC    Component Value Date/Time   WBC 6.6 12/05/2012 0500   RBC 4.06 12/05/2012 0500   HGB 12.3 12/05/2012 0500   HCT 36.1 12/05/2012 0500   PLT 227 12/05/2012 0500   MCV 88.9 12/05/2012 0500   MCH 30.3 12/05/2012 0500   MCHC 34.1 12/05/2012 0500   RDW 13.2 12/05/2012 0500   LYMPHSABS 1.5 12/04/2012 0924   MONOABS 0.7 12/04/2012 0924  EOSABS 0.2 12/04/2012 0924   BASOSABS 0.0 12/04/2012 0924   Magnesium 1.9   TSH pending  Discharge Exam: Blood pressure 140/70, pulse 70, temperature 98.3 F (36.8 C), temperature source Oral, resp. rate 18, height 5\' 1"  (1.549 m), weight 104 lb 14.4 oz (47.582 kg), SpO2 100.00%.   AM exam:  PE: General:Pleasant affect, NAD  Skin:Warm and dry, brisk capillary refill  HEENT:normocephalic, sclera clear, mucus membranes moist  Heart:S1S2 RRR with soft systolic murmur, no gallup, rub or click  Lungs:clear without rales, rhonchi, or wheezes  ZOX:WRUE, non tender, + BS, do not palpate liver spleen or masses  Ext:no lower ext edema,  Neuro:alert and  oriented, MAE, follows commands, + facial symmetry   Disposition: 01-Home or Self Care       Future Appointments Provider Department Dept Phone   12/09/2012 11:00 AM [facility] [address] [phone]       Medication List    STOP taking these medications       hydrochlorothiazide 25 MG tablet  Commonly known as:  HYDRODIURIL      TAKE these medications       aspirin EC 81 MG tablet  Take 81 mg by mouth daily.     atenolol 50 MG tablet  Commonly known as:  TENORMIN  Take 25 mg by mouth daily.     brimonidine 0.2 % ophthalmic solution  Commonly known as:  ALPHAGAN  Place 1 drop into both eyes 2 (two) times daily.     calcium carbonate 1250 MG tablet  Commonly known as:  OS-CAL - dosed in mg of elemental calcium  Take 2 tablets by mouth daily with breakfast.     clopidogrel 75 MG tablet  Commonly known as:  PLAVIX  Take 75 mg by mouth daily.     dorzolamide-timolol 22.3-6.8 MG/ML ophthalmic solution  Commonly known as:  COSOPT  Place 1 drop into both eyes 2 (two) times daily.     polyethylene glycol packet  Commonly known as:  MIRALAX / GLYCOLAX  Take 17 g by mouth daily as needed.     pravastatin 40 MG tablet  Commonly known as:  PRAVACHOL  Take 40 mg by mouth daily.     risedronate 35 MG tablet  Commonly known as:  ACTONEL  Take 35 mg by mouth every 7 (seven) days. On Saturday    -   with water on empty stomach, nothing by mouth or lie down for next 30 minutes.     verapamil 180 MG 24 hr capsule  Commonly known as:  VERELAN PM  Take 180 mg by mouth daily.       Follow-up Information   Follow up with Thurmon Fair, MD. (our office will call with date and time)    Specialty:  Cardiology   Contact information:   [address] [place] [address] [phone]        Discharge Instructions: We will schedule you for a monitor to wear at home.  To see if we can get to the bottom of the  fading away spells.  You will be called on follow up with Dr. Royann Shivers.  Call us if you have any more problems.  Signed: Leone Brand Nurse Practitioner-Certified [facility]: [facility] 12/05/2012, 11:43 AM  Time spent on discharge :30 minutes.

## 2012-12-05 NOTE — ED Provider Notes (Signed)
Medical screening examination/treatment/procedure(s) were performed by non-physician practitioner and as supervising physician I was immediately available for consultation/collaboration.    Nelia Shi, MD 12/05/12 [phone]

## 2012-12-07 ENCOUNTER — Telehealth: Payer: Self-pay | Admitting: Cardiovascular Disease

## 2012-12-07 DIAGNOSIS — I454 Nonspecific intraventricular block: Secondary | ICD-10-CM

## 2012-12-07 DIAGNOSIS — R000 Tachycardia, unspecified: Secondary | ICD-10-CM

## 2012-12-07 DIAGNOSIS — R55 Syncope and collapse: Secondary | ICD-10-CM

## 2012-12-07 NOTE — Telephone Encounter (Signed)
Per Pamela Hayes ,   She needs to have a event monitor mailed to her in 2weeks..    Thanks

## 2012-12-07 NOTE — Telephone Encounter (Signed)
Forward to [facility] CMA

## 2012-12-07 NOTE — Telephone Encounter (Signed)
Monitor has been mailed to patient. LM informing patient of this.

## 2012-12-09 ENCOUNTER — Inpatient Hospital Stay (HOSPITAL_COMMUNITY): Admission: RE | Admit: 2012-12-09 | Payer: Medicare Other | Source: Ambulatory Visit

## 2012-12-15 DIAGNOSIS — R55 Syncope and collapse: Secondary | ICD-10-CM

## 2012-12-29 ENCOUNTER — Telehealth: Payer: Self-pay | Admitting: Cardiology

## 2012-12-29 NOTE — Telephone Encounter (Signed)
Please let pt/family know when she has her symptoms she is in normal rhythm.  She does have some fast heart beats at times, like we saw in the hospital.  We will continue to monitor.

## 2012-12-29 NOTE — Telephone Encounter (Signed)
LM to let patient and family know we received a weekly report on her Event Monitor.  Will continue to monitor.  With symptoms NSR shows, but she does have episodes of a rapid heart beat.

## 2013-01-05 ENCOUNTER — Ambulatory Visit (INDEPENDENT_AMBULATORY_CARE_PROVIDER_SITE_OTHER): Payer: Medicare Other | Admitting: Cardiovascular Disease

## 2013-01-05 ENCOUNTER — Encounter: Payer: Self-pay | Admitting: Cardiovascular Disease

## 2013-01-05 VITALS — BP 120/70 | HR 84 | Resp 18 | Ht 60.0 in | Wt 112.5 lb

## 2013-01-05 DIAGNOSIS — I472 Ventricular tachycardia, unspecified: Secondary | ICD-10-CM

## 2013-01-05 DIAGNOSIS — I4729 Other ventricular tachycardia: Secondary | ICD-10-CM

## 2013-01-05 DIAGNOSIS — I10 Essential (primary) hypertension: Secondary | ICD-10-CM

## 2013-01-05 DIAGNOSIS — R55 Syncope and collapse: Secondary | ICD-10-CM

## 2013-01-05 DIAGNOSIS — I119 Hypertensive heart disease without heart failure: Secondary | ICD-10-CM

## 2013-01-05 NOTE — Patient Instructions (Signed)
STOP Digoxin.  Your physician recommends that you schedule a follow-up appointment in: 6 months

## 2013-01-10 ENCOUNTER — Encounter: Payer: Self-pay | Admitting: Cardiovascular Disease

## 2013-01-10 NOTE — Progress Notes (Signed)
Patient ID: Pamela Hayes, female   DOB: Sep 05, 1914, 79 y.o.   MRN: [id number]      Reason for office visit Followup cardiac event monitor showing ventricular tachycardia  Pamela Hayes is 79 years old and has long-standing severe hypertension, she has complaints of fatigue, but no palpitations or syncope. She describes occasional episodes of fading away while at rest, sitting in a chair. She takes a Centrum acting calcium channel blocker, beta blocker and digoxin and I was worried that she might have episodes of bradycardia. There is no evidence of bradycardia on her monitor so far.  Her event monitor has recorded an asymptomatic 12 beat run of nonsustained ventricular tachycardia as well as several shorter 4 beat runs. He was also a 12 beat run of normal complex tachycardia. None of these episodes was symptomatic. On the couple of occasions that the patient activated the device for lightheadedness the rhythm was normal sinus with a rate of around 70 beats per minutes. She does have underlying first-degree AV block.   No Known Allergies  Current Outpatient Prescriptions  Medication Sig Dispense Refill  . aspirin EC 81 MG tablet Take 81 mg by mouth daily.      Marland Kitchen atenolol (TENORMIN) 50 MG tablet Take 25 mg by mouth daily.      . brimonidine (ALPHAGAN) 0.2 % ophthalmic solution Place 1 drop into both eyes 2 (two) times daily.       . calcium carbonate (OS-CAL - DOSED IN MG OF ELEMENTAL CALCIUM) 1250 MG tablet Take 2 tablets by mouth daily with breakfast.      . clopidogrel (PLAVIX) 75 MG tablet Take 75 mg by mouth daily.      . dorzolamide-timolol (COSOPT) 22.3-6.8 MG/ML ophthalmic solution Place 1 drop into both eyes 2 (two) times daily.      . hydrochlorothiazide (HYDRODIURIL) 25 MG tablet Take 25 mg by mouth daily.      . polyethylene glycol (MIRALAX / GLYCOLAX) packet Take 17 g by mouth daily as needed.  14 each  0  . pravastatin (PRAVACHOL) 40 MG tablet Take 40 mg by mouth daily.      .  risedronate (ACTONEL) 35 MG tablet Take 35 mg by mouth every 7 (seven) days. On Saturday    -   with water on empty stomach, nothing by mouth or lie down for next 30 minutes.      . verapamil (VERELAN PM) 180 MG 24 hr capsule Take 180 mg by mouth daily.       No current facility-administered medications for this visit.    Past Medical History  Diagnosis Date  . Heart murmur   . Coronary artery disease   . Hypertension   . High cholesterol   . LVH (left ventricular hypertrophy) due to hypertensive disease   . DVT (deep venous thrombosis)     "right" (12/04/2012)  . Exertional shortness of breath   . TIA (transient ischemic attack)     "she's had several; all awhile back" (12/04/2012)  . Arthritis     "joints" (12/04/2012)  . PVD (peripheral vascular disease)   . Tachycardia 12/05/2012    Past Surgical History  Procedure Laterality Date  . Cataract extraction w/ intraocular lens  implant, bilateral Bilateral     Family History  Problem Relation Age of Onset  . Stroke Mother   . Heart disease Father     History   Social History  . Marital Status: Widowed    Spouse Name: N/A  Number of Children: N/A  . Years of Education: N/A   Occupational History  . Not on file.   Social History Main Topics  . Smoking status: Never Smoker   . Smokeless tobacco: Never Used  . Alcohol Use: No  . Drug Use: No  . Sexual Activity: No   Other Topics Concern  . Not on file   Social History Narrative  . No narrative on file    Review of systems: The patient specifically denies any chest pain at rest exertion, dyspnea at rest, orthopnea, paroxysmal nocturnal dyspnea, syncope, palpitations, focal neurological deficits, intermittent claudication, lower extremity edema, unexplained weight gain, cough, hemoptysis or wheezing.     PHYSICAL EXAM BP 120/70  Pulse 84  Resp 18  Ht 5' (1.524 m)  Wt 112 lb 8 oz (51.03 kg)  BMI 21.97 kg/m2 General: Alert, oriented x3, no distress    Head: no evidence of trauma, PERRL, EOMI, no exophtalmos or lid lag, no myxedema, no xanthelasma; normal ears, nose and oropharynx  Neck: normal jugular venous pulsations and no hepatojugular reflux; brisk carotid pulses without delay and no carotid bruits  Chest: clear to auscultation, no signs of consolidation by percussion or palpation, normal fremitus, symmetrical and full respiratory excursions  Cardiovascular: normal position and quality of the apical impulse, regular rhythm, normal first and second heart sounds, no murmurs, rubs ; prominent s4  Abdomen: no tenderness or distention, no masses by palpation, no abnormal pulsatility or arterial bruits, normal bowel sounds, no hepatosplenomegaly  Extremities: no clubbing, cyanosis or edema; 2+ radial, ulnar and brachial pulses bilaterally; 2+ right femoral, posterior tibial and dorsalis pedis pulses; 2+ left femoral, posterior tibial and dorsalis pedis pulses; no subclavian or femoral bruits  Neurological: grossly nonfocal   EKG: NSR, 1st degree AV block, LVH, left ward axis, widened QRS (118 ms) and prominent ST depression in I, aVL and inverted T waves in almost all leads  Lipid Panel     Component Value Date/Time   CHOL  Value: 203        ATP III CLASSIFICATION:  <200     mg/dL   Desirable  161-096  mg/dL   Borderline High  >=045    mg/dL   High       * 40/98/1191 0400   TRIG 176* 01/22/2009 0400   HDL 32* 01/22/2009 0400   CHOLHDL 6.3 01/22/2009 0400   VLDL 35 01/22/2009 0400   LDLCALC  Value: 136        Total Cholesterol/HDL:CHD Risk Coronary Heart Disease Risk Table                     Men   Women  1/2 Average Risk   3.4   3.3  Average Risk       5.0   4.4  2 X Average Risk   9.6   7.1  3 X Average Risk  23.4   11.0        Use the calculated Patient Ratio above and the CHD Risk Table to determine the patient's CHD Risk.        ATP III CLASSIFICATION (LDL):  <100     mg/dL   Optimal  478-295  mg/dL   Near or Above                     Optimal  130-159  mg/dL   Borderline  621-308  mg/dL   High  >657  mg/dL   Very High* 98/12/9145 0400    BMET    Component Value Date/Time   NA 134* 12/05/2012 0500   K 4.5 12/05/2012 0500   CL 102 12/05/2012 0500   CO2 23 12/05/2012 0500   GLUCOSE 84 12/05/2012 0500   BUN 11 12/05/2012 0500   CREATININE 0.78 12/05/2012 0500   CALCIUM 9.0 12/05/2012 0500   GFRNONAA 68* 12/05/2012 0500   GFRAA 79* 12/05/2012 0500     ASSESSMENT AND PLAN  I think that Pamela Hayes age it is best to avoid a medication that is potentially highly toxic such as digoxin. Emotionally she is very attached to her "heart medicine" which she credits for having keeping her alive all this time. I have tried to commence her that it is potentially poisonous and could well be part of the reason she has ventricular tachycardia. I think it is important for her to continue her beta blocker but I don't think it would be appropriate to perform aggressive invasive evaluations in this 79 year old woman. We will see if stopping the digoxin has any positive impact on her symptoms.  Patient Instructions  STOP Digoxin.  Your physician recommends that you schedule a follow-up appointment in: 6 months     Meds ordered this encounter  Medications  . DISCONTD: digoxin (LANOXIN) 0.125 MG tablet    Sig: Take 125 mcg by mouth daily.  . hydrochlorothiazide (HYDRODIURIL) 25 MG tablet    Sig: Take 25 mg by mouth daily.    Junious Silk, MD, [facility] [phone] office [phone] pager

## 2013-03-07 ENCOUNTER — Emergency Department (HOSPITAL_COMMUNITY): Payer: Medicare Other

## 2013-03-07 ENCOUNTER — Emergency Department (HOSPITAL_COMMUNITY)
Admission: EM | Admit: 2013-03-07 | Discharge: 2013-03-07 | Disposition: A | Discharge status: Home / Self Care | Payer: Medicare Other | Attending: Emergency Medicine | Admitting: Emergency Medicine

## 2013-03-07 ENCOUNTER — Encounter (HOSPITAL_COMMUNITY): Payer: Self-pay | Admitting: Emergency Medicine

## 2013-03-07 DIAGNOSIS — M129 Arthropathy, unspecified: Secondary | ICD-10-CM | POA: Insufficient documentation

## 2013-03-07 DIAGNOSIS — R609 Edema, unspecified: Secondary | ICD-10-CM | POA: Insufficient documentation

## 2013-03-07 DIAGNOSIS — L97509 Non-pressure chronic ulcer of other part of unspecified foot with unspecified severity: Secondary | ICD-10-CM | POA: Insufficient documentation

## 2013-03-07 DIAGNOSIS — Z7902 Long term (current) use of antithrombotics/antiplatelets: Secondary | ICD-10-CM | POA: Insufficient documentation

## 2013-03-07 DIAGNOSIS — I2510 Atherosclerotic heart disease of native coronary artery without angina pectoris: Secondary | ICD-10-CM | POA: Insufficient documentation

## 2013-03-07 DIAGNOSIS — L97529 Non-pressure chronic ulcer of other part of left foot with unspecified severity: Secondary | ICD-10-CM

## 2013-03-07 DIAGNOSIS — R011 Cardiac murmur, unspecified: Secondary | ICD-10-CM | POA: Insufficient documentation

## 2013-03-07 DIAGNOSIS — I10 Essential (primary) hypertension: Secondary | ICD-10-CM | POA: Insufficient documentation

## 2013-03-07 DIAGNOSIS — M79609 Pain in unspecified limb: Secondary | ICD-10-CM | POA: Insufficient documentation

## 2013-03-07 DIAGNOSIS — E7800 Pure hypercholesterolemia, unspecified: Secondary | ICD-10-CM | POA: Insufficient documentation

## 2013-03-07 DIAGNOSIS — Z79899 Other long term (current) drug therapy: Secondary | ICD-10-CM | POA: Insufficient documentation

## 2013-03-07 DIAGNOSIS — Z8673 Personal history of transient ischemic attack (TIA), and cerebral infarction without residual deficits: Secondary | ICD-10-CM | POA: Insufficient documentation

## 2013-03-07 DIAGNOSIS — M79672 Pain in left foot: Secondary | ICD-10-CM

## 2013-03-07 DIAGNOSIS — Z7982 Long term (current) use of aspirin: Secondary | ICD-10-CM | POA: Insufficient documentation

## 2013-03-07 DIAGNOSIS — Z86718 Personal history of other venous thrombosis and embolism: Secondary | ICD-10-CM | POA: Insufficient documentation

## 2013-03-07 MED ORDER — BACITRACIN ZINC 500 UNIT/GM EX OINT
TOPICAL_OINTMENT | Freq: Two times a day (BID) | CUTANEOUS | Status: DC
Start: 2013-03-07 — End: 2013-03-07
  Administered 2013-03-07: 1 via TOPICAL

## 2013-03-07 MED ORDER — ACETAMINOPHEN-CODEINE #3 300-30 MG PO TABS
1.0000 | ORAL_TABLET | Freq: Once | ORAL | Status: AC
Start: 2013-03-07 — End: 2013-03-07
  Administered 2013-03-07: 1 via ORAL
  Filled 2013-03-07: qty 1

## 2013-03-07 MED ORDER — ACETAMINOPHEN-CODEINE #3 300-30 MG PO TABS: 1.0000 | ORAL_TABLET | Freq: Four times a day (QID) | ORAL | Status: DC | PRN

## 2013-03-07 NOTE — ED Provider Notes (Signed)
CSN: [id number]     Arrival date & time 03/07/13  1149 History   None   This chart was scribed for Trixie Dredge PA-C, a non-physician practitioner working with Junius Argyle, MD by Lewanda Rife, ED Scribe. This patient was seen in room TR09C/TR09C and the patient's care was started at 12:21 PM      Chief Complaint  Patient presents with  . Foot Pain   (Consider location/radiation/quality/duration/timing/severity/associated sxs/prior Treatment) The history is provided by the patient and a relative. No language interpreter was used.   HPI Comments: Pamela Hayes is a 80 y.o. female who presents to the Emergency Department with waxing and waning chronic feet swelling x years complaining of constant moderate left foot pain onset 1 month. Reports associated bilateral foot swelling worse on left, and mild abrasions on left foot. Reports extended immobility in a wheel chair due to decreased strength. Reports pain is exacerbated by walking, by touch, and ROM of left foot. Reports swelling is mildly alleviated with elevation, but pain remains unchanged. Denies associated recent trauma, numbness, nausea, emesis, dizziness, and weakness. Denies recent long travel, prior history of PE, unilateral leg swelling, active cancer, or estrogen (birth control) use.  Reports she is on Plavix. Reports PMHx of DVT.    Past Medical History  Diagnosis Date  . Heart murmur   . Coronary artery disease   . Hypertension   . High cholesterol   . LVH (left ventricular hypertrophy) due to hypertensive disease   . DVT (deep venous thrombosis)     "right" (12/04/2012)  . Exertional shortness of breath   . TIA (transient ischemic attack)     "she's had several; all awhile back" (12/04/2012)  . Arthritis     "joints" (12/04/2012)  . PVD (peripheral vascular disease)   . Tachycardia 12/05/2012   Past Surgical History  Procedure Laterality Date  . Cataract extraction w/ intraocular lens  implant, bilateral  Bilateral    Family History  Problem Relation Age of Onset  . Stroke Mother   . Heart disease Father    History  Substance Use Topics  . Smoking status: Never Smoker   . Smokeless tobacco: Never Used  . Alcohol Use: No   OB History   Grav Para Term Preterm Abortions TAB SAB Ect Mult Living                 Review of Systems  Constitutional: Negative for fever.  Cardiovascular: Positive for leg swelling.  Gastrointestinal: Negative for nausea and vomiting.  Skin: Positive for wound.  Neurological: Negative for weakness.  Psychiatric/Behavioral: Negative for confusion.    Allergies  Review of patient's allergies indicates no known allergies.  Home Medications   Current Outpatient Rx  Name  Route  Sig  Dispense  Refill  . aspirin EC 81 MG tablet   Oral   Take 81 mg by mouth daily.         Marland Kitchen atenolol (TENORMIN) 50 MG tablet   Oral   Take 25 mg by mouth daily.         . brimonidine (ALPHAGAN) 0.2 % ophthalmic solution   Both Eyes   Place 1 drop into both eyes 2 (two) times daily.          . calcium carbonate (OS-CAL - DOSED IN MG OF ELEMENTAL CALCIUM) 1250 MG tablet   Oral   Take 2 tablets by mouth daily with breakfast.         . clopidogrel (  PLAVIX) 75 MG tablet   Oral   Take 75 mg by mouth daily.         . dorzolamide-timolol (COSOPT) 22.3-6.8 MG/ML ophthalmic solution   Both Eyes   Place 1 drop into both eyes 2 (two) times daily.         . hydrochlorothiazide (HYDRODIURIL) 25 MG tablet   Oral   Take 25 mg by mouth daily.         . polyethylene glycol (MIRALAX / GLYCOLAX) packet   Oral   Take 17 g by mouth daily as needed.   14 each   0   . pravastatin (PRAVACHOL) 40 MG tablet   Oral   Take 40 mg by mouth daily.         . risedronate (ACTONEL) 35 MG tablet   Oral   Take 35 mg by mouth every 7 (seven) days. On Saturday    -   with water on empty stomach, nothing by mouth or lie down for next 30 minutes.         . verapamil  (VERELAN PM) 180 MG 24 hr capsule   Oral   Take 180 mg by mouth daily.          BP 124/49  Pulse 70  Temp(Src) 98.5 F (36.9 C) (Oral)  Resp 18  SpO2 96% Physical Exam  Nursing note and vitals reviewed. Constitutional: She appears well-developed and well-nourished. No distress.  HENT:  Head: Normocephalic and atraumatic.  Neck: Neck supple.  Cardiovascular: Intact distal pulses and normal pulses.   Pulses:      Dorsalis pedis pulses are 2+ on the right side, and 2+ on the left side.       Posterior tibial pulses are 2+ on the right side, and 2+ on the left side.  Cap refill < 3 seconds   Pulmonary/Chest: Effort normal.  Musculoskeletal: She exhibits edema.       Feet:  Pitting pedal edema to bilateral ankles  TTP lateral 1st MTP with small ulcerated area. No discharge.    (please see image below)  No calf tenderness or edema   Neurological: She is alert.  Skin: She is not diaphoretic. There is erythema.  Small aberrated area of lateral interphalangeal joint of first toe     ED Course  Procedures   COORDINATION OF CARE:  Nursing notes reviewed. Vital signs reviewed. Initial pt interview and examination performed.   12:24 PM-Discussed work up plan with pt at bedside, which includes x-ray of left foot. Pt agrees with plan.   Treatment plan initiated:Medications - No data to display   Initial diagnostic testing ordered.    Labs Review Labs Reviewed - No data to display Imaging Review Dg Foot Complete Left  03/07/2013   CLINICAL DATA:  Left foot pain  EXAM: LEFT FOOT - COMPLETE 3+ VIEW  COMPARISON:  None.  FINDINGS: Diffuse osteopenia. Normal alignment. No definite displaced fracture. Mild degenerative changes of the left 1st MTP joint. Mild diffuse soft tissue edema. Peripheral vascular calcification noted.  IMPRESSION: Osteopenia and minor degenerative change. No acute osseous finding by plain radiography   Electronically Signed   By: Ruel Favors M.D.   On:  03/07/2013 13:30    12:39 PM Dr. Romeo Apple evaluated pt. Per our discussion treatment plan is bacitracin and PCP follow up   1:56 PM Nursing Notes Reviewed/ Care Coordinated Applicable Imaging Reviewed and incorporated into ED treatment Discussed results and treatment plan with pt. Pt demonstrates understanding  and agrees with plan.        EKG Interpretation   None       MDM   1. Foot ulcer, left   2. Left foot pain    Patient with chronic bilateral pitting edema to the feet that is unchanged and with one month of ulcerated abrasion to the medial aspect of the first MTP. There is no discharge no surrounding erythema no warmth no evidence of superinfection. Dr. Romeo Apple also saw the patient and examined her feet and recommended bacitracin and bandages with primary care followup. Patient reports severe pain in his not controlled with Tylenol. Patient discharged home with Tylenol 3. We discussed the increase risk of fall with Tylenol 3. Patient has help at home all the time including help with transfers. Xray shows osteopenia and degenerative changes of first MTP joint. Patient discharged home with PCP followup. Pt is not diabetic. Discussed result, findings, treatment, and follow up  with patient.  Pt given return precautions.  Pt verbalizes understanding and agrees with plan.      I doubt any other EMC precluding discharge at this time including, but not necessarily limited to the following: Osteomyelitis, cellulitis, abscess, DVT  I personally performed the services described in this documentation, which was scribed in my presence. The recorded information has been reviewed and is accurate.   Trixie Dredge, PA-C 03/07/13 [phone]

## 2013-03-07 NOTE — ED Notes (Signed)
Pt is here with left foot pain and swelling for about one month.  Not diabetic.  Pulse present

## 2013-03-07 NOTE — Discharge Instructions (Signed)
Read the information below.  Use the prescribed medication as directed.  Please discuss all new medications with your pharmacist.  Do not take additional tylenol while taking the prescribed pain medication to avoid overdose.  You may return to the Emergency Department at any time for worsening condition or any new symptoms that concern you.  .If you develop redness, increased swelling, pus draining from the wound, or fevers greater than 100.4, return to the ER immediately for a recheck.

## 2013-03-08 NOTE — ED Provider Notes (Signed)
Medical screening examination/treatment/procedure(s) were conducted as a shared visit with non-physician practitioner(s) and myself.  I personally evaluated the patient during the encounter.  EKG Interpretation   None       I interviewed and examined the patient. Lungs are CTAB. Cardiac exam wnl. Abdomen soft.  2 erythematous shallow ulcers on the distal medial aspect of the left foot. I suspect the lesions are from friction in her shoes. VS unremarkable here. No reported hx of DM. Will recommend bacitracin and bandage to protect the wounds. Do not think the wounds are infectious in nature. Will recommend close f/u w/ her pcp to monitor progress of healing.   Junius Argyle, MD 03/08/13 [phone]

## 2013-03-18 ENCOUNTER — Ambulatory Visit: Payer: Medicare Other | Attending: Internal Medicine | Admitting: Physical Therapy

## 2013-03-18 DIAGNOSIS — M069 Rheumatoid arthritis, unspecified: Secondary | ICD-10-CM | POA: Insufficient documentation

## 2013-03-18 DIAGNOSIS — Z8673 Personal history of transient ischemic attack (TIA), and cerebral infarction without residual deficits: Secondary | ICD-10-CM | POA: Insufficient documentation

## 2013-03-18 DIAGNOSIS — R262 Difficulty in walking, not elsewhere classified: Secondary | ICD-10-CM | POA: Insufficient documentation

## 2013-03-18 DIAGNOSIS — IMO0001 Reserved for inherently not codable concepts without codable children: Secondary | ICD-10-CM | POA: Insufficient documentation

## 2013-04-02 ENCOUNTER — Encounter (HOSPITAL_BASED_OUTPATIENT_CLINIC_OR_DEPARTMENT_OTHER): Payer: Medicare Other | Attending: General Surgery

## 2013-04-02 DIAGNOSIS — L8990 Pressure ulcer of unspecified site, unspecified stage: Secondary | ICD-10-CM | POA: Insufficient documentation

## 2013-04-02 DIAGNOSIS — L8992 Pressure ulcer of unspecified site, stage 2: Secondary | ICD-10-CM | POA: Insufficient documentation

## 2013-04-02 DIAGNOSIS — L89899 Pressure ulcer of other site, unspecified stage: Secondary | ICD-10-CM | POA: Insufficient documentation

## 2013-04-30 ENCOUNTER — Encounter (HOSPITAL_BASED_OUTPATIENT_CLINIC_OR_DEPARTMENT_OTHER): Payer: Medicare Other | Attending: General Surgery

## 2013-04-30 DIAGNOSIS — L89899 Pressure ulcer of other site, unspecified stage: Secondary | ICD-10-CM | POA: Insufficient documentation

## 2013-04-30 DIAGNOSIS — L8992 Pressure ulcer of unspecified site, stage 2: Secondary | ICD-10-CM | POA: Insufficient documentation

## 2013-05-26 ENCOUNTER — Other Ambulatory Visit: Payer: Self-pay | Admitting: Internal Medicine

## 2013-05-26 DIAGNOSIS — I739 Peripheral vascular disease, unspecified: Secondary | ICD-10-CM

## 2013-05-28 ENCOUNTER — Encounter (HOSPITAL_BASED_OUTPATIENT_CLINIC_OR_DEPARTMENT_OTHER): Payer: Medicare Other | Attending: General Surgery

## 2013-05-28 DIAGNOSIS — L97509 Non-pressure chronic ulcer of other part of unspecified foot with unspecified severity: Secondary | ICD-10-CM | POA: Insufficient documentation

## 2013-06-01 ENCOUNTER — Ambulatory Visit
Admission: RE | Admit: 2013-06-01 | Discharge: 2013-06-01 | Disposition: A | Discharge status: Home / Self Care | Payer: Medicare Other | Source: Ambulatory Visit | Attending: Internal Medicine | Admitting: Internal Medicine

## 2013-06-01 DIAGNOSIS — I739 Peripheral vascular disease, unspecified: Secondary | ICD-10-CM

## 2013-06-08 ENCOUNTER — Ambulatory Visit (INDEPENDENT_AMBULATORY_CARE_PROVIDER_SITE_OTHER): Payer: Medicare Other | Admitting: Cardiovascular Disease

## 2013-06-08 ENCOUNTER — Encounter: Payer: Self-pay | Admitting: Cardiovascular Disease

## 2013-06-08 VITALS — BP 134/75 | HR 89 | Ht 60.0 in | Wt 98.0 lb

## 2013-06-08 DIAGNOSIS — I779 Disorder of arteries and arterioles, unspecified: Secondary | ICD-10-CM | POA: Insufficient documentation

## 2013-06-08 DIAGNOSIS — I739 Peripheral vascular disease, unspecified: Secondary | ICD-10-CM | POA: Insufficient documentation

## 2013-06-08 NOTE — Assessment & Plan Note (Signed)
The patient was sent to me by Dr. Nicholos Johns for evaluation of peripheral arterial disease. She has a slowly healing wound on her left great toe.(metatarsal head). She has been treated at the [facility] long wound ambulates minimally and does not really complain of pain. At this point, given her age, lack of mobility and the fact that her small son has been stable I prefer a more approach with continued outpatient wound care.care center. Lower extremity Dopplers were performed at [facility] one week ago revealing a right ABI of 0.61 and a left ABI of 0.86. There were monophasic waveforms in the pedal arteries. She lives alone in a house and is minimally ambulatory. Her wound has remained stable and is followed by the [facility] outpatient wound care center.. At this point, given her age, minimal mobility and stability of her small ulcer I prefer a more conservative approach to her treatment.Marland KitchenMarland Kitchen

## 2013-06-08 NOTE — Progress Notes (Signed)
06/08/2013 Pamela Hayes Hayes   06/15/1914  [id number]  Primary Physician Pamela Hayes Fick, MD Primary Cardiologist: Pamela Hayes Gess MD Pamela Hayes Hayes   HPI:  Pamela Hayes Hayes is a 80 year old frail-appearing African American female accompanied by 2 of her daughters. She was referred by Dr. Nicholos Hayes for peripheral vascular evaluation because of a small nonhealing ulcer on her left foot. Her cardiologist is Dr. Royann Hayes. He last saw her in November at which time she complained of fatigue and palpitations. An event monitor show some nonsustained ventricular tachycardia which she was asymptomatic from. Her other problems include hypertension she is treated at the [facility] wound care center. She lives alone and is minimally ambulatory. Recent lower extremity arterial Doppler studies performed at [facility] revealed a right ABI of 0.610 left ABI 0.86. She had tibial vessel disease.   Current Outpatient Prescriptions  Medication Sig Dispense Refill  . acetaminophen (TYLENOL) 325 MG tablet Take 650 mg by mouth every 6 (six) hours as needed.      Marland Kitchen aspirin EC 81 MG tablet Take 81 mg by mouth daily.      Marland Kitchen atenolol (TENORMIN) 50 MG tablet Take 25 mg by mouth daily.      . brimonidine (ALPHAGAN) 0.2 % ophthalmic solution Place 1 drop into both eyes 2 (two) times daily.       . calcium carbonate (OS-CAL - DOSED IN MG OF ELEMENTAL CALCIUM) 1250 MG tablet Take 2 tablets by mouth daily with breakfast.      . clopidogrel (PLAVIX) 75 MG tablet Take 75 mg by mouth daily.      . dorzolamide-timolol (COSOPT) 22.3-6.8 MG/ML ophthalmic solution Place 1 drop into both eyes 2 (two) times daily.      . hydrochlorothiazide (HYDRODIURIL) 25 MG tablet Take 25 mg by mouth daily.      . polyethylene glycol (MIRALAX / GLYCOLAX) packet Take 17 g by mouth daily as needed.  14 each  0  . pravastatin (PRAVACHOL) 40 MG tablet Take 40 mg by mouth daily.      . risedronate (ACTONEL) 35 MG tablet Take 35  mg by mouth every 7 (seven) days. On Saturday    -   with water on empty stomach, nothing by mouth or lie down for next 30 minutes.      Marland Kitchen SANTYL ointment Apply 1 application topically every 3 (three) days.      . verapamil (VERELAN PM) 180 MG 24 hr capsule Take 180 mg by mouth daily.       No current facility-administered medications for this visit.    No Known Allergies  History   Social History  . Marital Status: Widowed    Spouse Name: N/A    Number of Children: N/A  . Years of Education: N/A   Occupational History  . Not on file.   Social History Main Topics  . Smoking status: Never Smoker   . Smokeless tobacco: Never Used  . Alcohol Use: No  . Drug Use: No  . Sexual Activity: No   Other Topics Concern  . Not on file   Social History Narrative  . No narrative on file     Review of Systems: General: negative for chills, fever, night sweats or weight changes.  Cardiovascular: negative for chest pain, dyspnea on exertion, edema, orthopnea, palpitations, paroxysmal nocturnal dyspnea or shortness of breath Dermatological: negative for rash Respiratory: negative for cough or wheezing Urologic: negative for hematuria Abdominal: negative for nausea, vomiting, diarrhea, bright  red blood per rectum, melena, or hematemesis Neurologic: negative for visual changes, syncope, or dizziness All other systems reviewed and are otherwise negative except as noted above.    Blood pressure 134/75, pulse 89, height 5' (1.524 m), weight 98 lb (44.453 kg).  General appearance: alert and no distress Neck: no adenopathy, no carotid bruit, no JVD, supple, symmetrical, trachea midline and thyroid not enlarged, symmetric, no tenderness/mass/nodules Lungs: clear to auscultation bilaterally Heart: regular rate and rhythm, S1, S2 normal, no murmur, click, rub or gallop Extremities: extremities normal, atraumatic, no cyanosis or edema and hher feet are cool. She has  barely palpable pedal  pulses.  EKG not performed today  ASSESSMENT AND PLAN:   Peripheral arterial disease The patient was sent to me by Dr. Nicholos Hayes for evaluation of peripheral arterial disease. She has a slowly healing wound on her left great toe.(metatarsal head). She has been treated at the [facility] long wound ambulates minimally and does not really complain of pain. At this point, given her age, lack of mobility and the fact that her small son has been stable I prefer a more approach with continued outpatient wound care.care center. Lower extremity Dopplers were performed at [facility] one week ago revealing a right ABI of 0.61 and a left ABI of 0.86. There were monophasic waveforms in the pedal arteries. She lives alone in a house and is minimally ambulatory. Her wound has remained stable and is followed by the [facility] outpatient wound care center.. At this point, given her age, minimal mobility and stability of her small ulcer I prefer a more conservative approach to her treatment...      Pamela Hayes Gess MD FACP,FACC,FAHA, [facility] 06/08/2013 3:45 PM

## 2013-06-08 NOTE — Patient Instructions (Signed)
Your physician wants you to follow-up in: 6 months with Dr Berry. You will receive a reminder letter in the mail two months in advance. If you don't receive a letter, please call our office to schedule the follow-up appointment.  

## 2013-06-15 NOTE — Progress Notes (Signed)
[facility]  NAME:  Pamela Hayes, Pamela Hayes NO.:  [id number]  MEDICAL RECORD NO.:  [id number]      DATE OF BIRTH:  December 18, 1914  PHYSICIAN:  Glenna Fellows, MD    VISIT DATE:  06/14/2013                                  OFFICE VISIT   The patient is a 80 year old nonambulatory female with chronic left foot ulceration over her medial metatarsal head, and she is accompanied by her daughters who perform her wound care.  Her current wound care has been Santyl.  She generally follows in Dr. Lavone Neri clinic.  Since her last visit, she has been seen by Dr. Allyson Sabal who reviewed her abnormal imaging.  She was noted to have on ultrasound a right ABI of 0.61 and left ABI of 0.86.  Given her age and nonambulatory status, Dr. Allyson Sabal recommended continued local wound care and no intervention.  Last laboratory available is from October 2014, which revealed hemoglobin of 12.3.  Foot x-ray from January 2015 showed osteopenia with peripheral vascular calcification, degenerative changes of the first metatarsal joint were noted with no evidence of osseous destruction.  PHYSICAL EXAMINATION:  Blood pressure is 121/77, pulse is 76, temperature is 98.3.  Open wound over the left foot is measured 0.8 x 0.8 x 0.1 cm.  After application of topical anesthetic, forceps were used to remove the slough from the base of the wound for selective debridement over its entirety.  Plan to hold Santyl at this point and return to collagen dressing to be performed by the daughter 3 times weekly.  The patient is largely in bed. She uses slippers when she is out of the house, but has no shoes in place while in bed.  The family also reports concerns for her toenails and drainage from beneath her toenails.  I counseled that she should seek consultation with Podiatry for this.  They will follow up in 2 weeks' time.          ______________________________ Glenna Fellows, MD  MBA     BT/MEDQ  D:  06/14/2013  T:  06/15/2013  Job:  [id number]

## 2013-06-28 ENCOUNTER — Encounter (HOSPITAL_BASED_OUTPATIENT_CLINIC_OR_DEPARTMENT_OTHER): Payer: Medicare Other | Attending: Plastic Surgery

## 2013-06-29 ENCOUNTER — Telehealth: Payer: Self-pay | Admitting: *Deleted

## 2013-06-29 ENCOUNTER — Ambulatory Visit (INDEPENDENT_AMBULATORY_CARE_PROVIDER_SITE_OTHER): Payer: Medicare Other

## 2013-06-29 VITALS — BP 100/61 | HR 70 | Resp 16 | Ht 60.0 in | Wt 98.0 lb

## 2013-06-29 DIAGNOSIS — I739 Peripheral vascular disease, unspecified: Secondary | ICD-10-CM

## 2013-06-29 DIAGNOSIS — M79609 Pain in unspecified limb: Secondary | ICD-10-CM

## 2013-06-29 DIAGNOSIS — I70209 Unspecified atherosclerosis of native arteries of extremities, unspecified extremity: Secondary | ICD-10-CM

## 2013-06-29 DIAGNOSIS — L97509 Non-pressure chronic ulcer of other part of unspecified foot with unspecified severity: Secondary | ICD-10-CM

## 2013-06-29 DIAGNOSIS — L98499 Non-pressure chronic ulcer of skin of other sites with unspecified severity: Secondary | ICD-10-CM

## 2013-06-29 DIAGNOSIS — B351 Tinea unguium: Secondary | ICD-10-CM

## 2013-06-29 NOTE — Patient Instructions (Signed)
ANTIBACTERIAL SOAP INSTRUCTIONS  THE DAY AFTER PROCEDURE  Please follow the instructions your doctor has marked.   Shower as usual. Before getting out, place a drop of antibacterial liquid soap (Dial) on a wet, clean washcloth.  Gently wipe washcloth over affected area.  Afterward, rinse the area with warm water.  Blot the area dry with a soft cloth and cover with antibiotic ointment (neosporin, polysporin, bacitracin) and band aid or gauze and tape  Place 3-4 drops of antibacterial liquid soap in a quart of warm tap water.  Submerge foot into water for 20 minutes.  If bandage was applied after your procedure, leave on to allow for easy lift off, then remove and continue with soak for the remaining time.  Next, blot area dry with a soft cloth and cover with a bandage.  Apply other medications as directed by your doctor, such as cortisporin otic solution (eardrops) or neosporin antibiotic ointment  Recommend after cleansing or washing foot with soap and water daily apply Iodosorb and a light gauze dressing all the wound sites both feet maintain dressings daily as instructed, contact us immediately if any changes increased redness fever chills or increased pus or drainage were to occur

## 2013-06-29 NOTE — Progress Notes (Signed)
Subjective:    Patient ID: Pamela Hayes, female    DOB: 03-21-1914, 80 y.o.   MRN: [id number]  HPI Comments: I have a sore toe"  Patient c/o tenderness 1st toe right for few weeks. The toenail has been loose and fell off last night. She has ulcers on the left foot (medial 1st MPJ and 1st toe) that is being treated by the wound center. When I removed the bandage from the left foot, the 1st toe under the the nail, was oozing. The wound center has been using a different ointment, but her family has been using Iodosorb gel on the the ulcers on the left foot.  Toe Pain       Review of Systems  Cardiovascular: Positive for leg swelling.  Endocrine: Positive for cold intolerance.  Musculoskeletal: Positive for gait problem.  Skin: Positive for wound.  Neurological: Positive for dizziness.  All other systems reviewed and are negative.      Objective:   Physical Exam Pamela Hayes is a 80 year old afterward developed since the both daughters present at today's visit patient appears to be somewhat alert oriented although not completely as to what is going on she is responsive to discussion and exam does have some pain and sensitivity of her feet on attempted exam she was somewhat resistant initially although vestry cooperated with examined and treatment. Okay objective findings as follows vascular status is diminished with thready nonpalpable pedal pulses DP or PT bilateral zero over four temperature cool turgor diminished capillary refill timed 6 seconds or greater bilateral hallux on there is open wound medial first MTP area of the left foot approximately a centimeter by half centimeter with some mild serous drainage to trim and drainage noted no malodor no ascending psoas lymphangitis noted no increased temperature. Mild varicosities noted as well there is also weeping from the hallux nail plate or nailbed both hallux in somewhat of the second digits bilateral as well due to thickening  hypertrophy and proptosis of nails patient been applying Iodosorb and shooting forcibly to the nail beds and then dried and seem to be improving according to the patient's family they been using Santyl for the ulcer first MTP area left to the review Pamela Hayes very by her vascular status as well the radiology report indicating no signs of osteomyelitis significant osteopenic asked her arthropathy changes are noted. Orthopedic biomechanical exam reveals digital contractures semirigid in nature there is mild HAV deformity about prominence of the first MTP area bilateral with associated ulceration left is noted. Ulceration hallux nail bed insulin second digits bilateral. Patient is nonambulatory in a wheelchair for transport) to keep socks or slippers on her feet       Assessment & Plan:  Assessment this time is patient does have history of vascular compromise and peripheral vascular disease with ischemic changes ulceration slow healing of first MTP area left and ulceration of nail beds hallux bilateral and second digits bilateral. Plan at this time the thick and brittle crumbly dystrophic nails hallux and second digits bilateral debridement at this time that the hallux nails were for the most part looser troubles completely separate from the nailbed following debridement the Iodosorb and a light gauze dressing applied to the hallux and second digits bilateral also at this time is are applied to the ulcer site first MTP area incorporated with left foot dressing as well suggested switching to Iodosorb and gauze dressing to dry the wound that still weeping in the first MTP area of likely chronic  ischemic wound with some mild maceration and using the central our no active sign of infection is noted no ascending psoas lymphangitis the ulcer sites are cleansed and debrided at this time I does or gauze dressings are done did order Iodosorb from medical supply to be furnished the patient directly as well is gauze and roll  gauze dressings for daily dressing changes suggestive followup in 2-3 weeks for reevaluation and additional debridement as needed  Alvan Dame DPM

## 2013-06-29 NOTE — Telephone Encounter (Signed)
Wound Care products ordered for 90 days. Sterile 2x2 gauze pads, Iodosorb Wound Gel Large tube Sig:  Apply Daily with dressing change   [facility]: [phone]

## 2013-07-21 ENCOUNTER — Ambulatory Visit: Payer: Medicare Other

## 2013-07-23 ENCOUNTER — Ambulatory Visit (INDEPENDENT_AMBULATORY_CARE_PROVIDER_SITE_OTHER): Payer: Medicare Other

## 2013-07-23 VITALS — BP 125/72 | HR 67 | Temp 97.0°F | Resp 16 | Ht 60.0 in | Wt 96.0 lb

## 2013-07-23 DIAGNOSIS — L97509 Non-pressure chronic ulcer of other part of unspecified foot with unspecified severity: Secondary | ICD-10-CM

## 2013-07-23 DIAGNOSIS — I739 Peripheral vascular disease, unspecified: Secondary | ICD-10-CM

## 2013-07-23 DIAGNOSIS — M79609 Pain in unspecified limb: Secondary | ICD-10-CM

## 2013-07-23 DIAGNOSIS — L98499 Non-pressure chronic ulcer of skin of other sites with unspecified severity: Secondary | ICD-10-CM

## 2013-07-23 DIAGNOSIS — I70209 Unspecified atherosclerosis of native arteries of extremities, unspecified extremity: Secondary | ICD-10-CM

## 2013-07-23 DIAGNOSIS — B351 Tinea unguium: Secondary | ICD-10-CM

## 2013-07-23 NOTE — Patient Instructions (Signed)
ANTIBACTERIAL SOAP INSTRUCTIONS  THE DAY AFTER PROCEDURE  Please follow the instructions your doctor has marked.   Shower as usual. Before getting out, place a drop of antibacterial liquid soap (Dial) on a wet, clean washcloth.  Gently wipe washcloth over affected area.  Afterward, rinse the area with warm water.  Blot the area dry with a soft cloth and cover with antibiotic ointment (neosporin, polysporin, bacitracin) and band aid or gauze and tape  Place 3-4 drops of antibacterial liquid soap in a quart of warm tap water.  Submerge foot into water for 20 minutes.  If bandage was applied after your procedure, leave on to allow for easy lift off, then remove and continue with soak for the remaining time.  Next, blot area dry with a soft cloth and cover with a bandage.  Apply other medications as directed by your doctor, such as cortisporin otic solution (eardrops) or neosporin antibiotic ointment  After washing warm soapy water or Epsom salts and water dried thoroughly and apply the Iodosorb antibiotic ointment and a light gauze dressing and paper tape. Maintain clean white cotton socks to keep the feet and toes cushioned and protected as well

## 2013-07-23 NOTE — Progress Notes (Signed)
   Subjective:    Patient ID: Pamela Hayes, female    DOB: [birth year], 80 y.o.   MRN: [id number]  HPI Comments: Pt states the toes are better.  Pt presents with B/L hallux nail areas covered with Iodosorb and the left with a gauze dressing.     Review of Systems no new findings or systemic changes noted     Objective:   Physical Exam Lower extremity as follows has diminished pedal pulses are absent pulses DP and PT zero over four bilateral patient had ulcerations the hallux nail beds both hallux injury with Iodosorb however the nailbeds appear be clean and dry no longer macerated no malodor no discharge or drainage noted of the second digits also bilateral have cleared and noted no maceration no discharge or drainage or malodor. There is still an ulceration about half centimeter diameter medial first MTP joint left foot with some serous discharge drainage and some pain tenderness on palpation and with enclosed shoe wear and an on debridement and cleansing areas cleansed with all cleansed some of the hemorrhage a keratosis is debrided away Iodosorb and gauze dressing was reapplied to the first MTP joint at this visit.       Assessment & Plan:  Assessment resolved ulcerations the hallux and lesser digits of both feet however continues to have ulceration of the MTP joint first left which is treated with Iodosorb and gauze dressing continue with offloading and cushion slippers and shoes at all times patient is in a wheelchair minimally ambulatory. There is no ascending psoas lymphangitis recheck in one month or 4 weeks for further followup continue with daily dressing changes and cleansing is as instructed. Next  Alvan Dame DPM

## 2013-08-24 ENCOUNTER — Ambulatory Visit (INDEPENDENT_AMBULATORY_CARE_PROVIDER_SITE_OTHER): Payer: Medicare Other

## 2013-08-24 VITALS — BP 112/67 | HR 74 | Temp 96.0°F | Resp 15 | Ht 62.0 in | Wt 98.0 lb

## 2013-08-24 DIAGNOSIS — I739 Peripheral vascular disease, unspecified: Secondary | ICD-10-CM

## 2013-08-24 DIAGNOSIS — M79609 Pain in unspecified limb: Secondary | ICD-10-CM

## 2013-08-24 DIAGNOSIS — M79606 Pain in leg, unspecified: Secondary | ICD-10-CM

## 2013-08-24 DIAGNOSIS — I70209 Unspecified atherosclerosis of native arteries of extremities, unspecified extremity: Secondary | ICD-10-CM

## 2013-08-24 DIAGNOSIS — L98499 Non-pressure chronic ulcer of skin of other sites with unspecified severity: Secondary | ICD-10-CM

## 2013-08-24 DIAGNOSIS — L97509 Non-pressure chronic ulcer of other part of unspecified foot with unspecified severity: Secondary | ICD-10-CM

## 2013-08-24 DIAGNOSIS — B351 Tinea unguium: Secondary | ICD-10-CM

## 2013-08-24 NOTE — Patient Instructions (Signed)
ANTIBACTERIAL SOAP INSTRUCTIONS  THE DAY AFTER PROCEDURE  Please follow the instructions your doctor has marked.   Shower as usual. Before getting out, place a drop of antibacterial liquid soap (Dial) on a wet, clean washcloth.  Gently wipe washcloth over affected area.  Afterward, rinse the area with warm water.  Blot the area dry with a soft cloth and cover with antibiotic ointment (neosporin, polysporin, bacitracin) and band aid or gauze and tape  Place 3-4 drops of antibacterial liquid soap in a quart of warm tap water.  Submerge foot into water for 20 minutes.  If bandage was applied after your procedure, leave on to allow for easy lift off, then remove and continue with soak for the remaining time.  Next, blot area dry with a soft cloth and cover with a bandage.  Apply other medications as directed by your doctor, such as cortisporin otic solution (eardrops) or neosporin antibiotic ointment  After washing foot daily or every other day applied the Iodosorb antibiotic cream both to the great toe joint of the left foot and the big toe joint of the left change dressings daily as instructed

## 2013-08-24 NOTE — Progress Notes (Signed)
   Subjective:    Patient ID: Pamela Hayes, female    DOB: 09-07-1914, 80 y.o.   MRN: [id number]  HPI Comments: Pt presents to office for follow up on B/L hallux toenail and left 1st MPJ ulcers.  Pt's left hallux has drainage on the white sock and the left 1st MPJ has drainage on the gauze.  There were no dressings on the hallux B/L.     Review of Systems no new findings or systemic changes noted    Objective:   Physical Exam 89 her left femur in the process this time with family members for followup ulcer the ulcer of the right hallux nail that has resolved there is no discharge or drainage however the ulcer of the left foot first MTP joint still approximately 0.6 x 0.8 cm in diameter. Also ulceration of the hallux nail bed with dystrophy discoloration and friability of mycotic nail. Mild serous drainage noted on both sides with mild slight bloody and discharge drainage noted within the bandages are dressings. The been doing Iodosorb and gauze dressings to the ulcer site as instructed  No changes in neurovascular status thready nonpalpable pedal pulses bilateral absent epicritic sensation bilateral semirigid digital contractures noted as well as severe hypertrophy and proptosis proptosis of nails 1 through 5 bilateral.       Assessment & Plan:  Assessment peripheral vascular compromise with ulceration of left foot hallux ulcer still present as well as first MTP ulcer still present these. Clean stable no ascending cellulitis lymphangitis no fever chills no purulent discharge mild serous drainage is noted at this time Iodosorb and gauze dressings are reapplied following debridement of both ulcer sites down to subcutaneous tissue level of the subcutaneous the dermal subcutaneous junction on the ulcer first MTP area the nailbed ulcer still is a pink granular base with mild maceration. Both are debrided dressed with is arm and gauze dressing reappointed in 3-4 weeks for further followup continue  with daily wound care as instructed  Alvan Dame DPM

## 2013-09-21 ENCOUNTER — Ambulatory Visit (INDEPENDENT_AMBULATORY_CARE_PROVIDER_SITE_OTHER): Payer: Medicare Other

## 2013-09-21 VITALS — BP 130/64 | HR 86 | Resp 12

## 2013-09-21 DIAGNOSIS — L98499 Non-pressure chronic ulcer of skin of other sites with unspecified severity: Secondary | ICD-10-CM

## 2013-09-21 DIAGNOSIS — L97509 Non-pressure chronic ulcer of other part of unspecified foot with unspecified severity: Secondary | ICD-10-CM

## 2013-09-21 DIAGNOSIS — M79606 Pain in leg, unspecified: Secondary | ICD-10-CM

## 2013-09-21 DIAGNOSIS — I739 Peripheral vascular disease, unspecified: Secondary | ICD-10-CM

## 2013-09-21 DIAGNOSIS — I70209 Unspecified atherosclerosis of native arteries of extremities, unspecified extremity: Secondary | ICD-10-CM

## 2013-09-21 DIAGNOSIS — M79609 Pain in unspecified limb: Secondary | ICD-10-CM

## 2013-09-21 NOTE — Patient Instructions (Signed)
ANTIBACTERIAL SOAP INSTRUCTIONS  THE DAY AFTER PROCEDURE  Please follow the instructions your doctor has marked.   Shower as usual. Before getting out, place a drop of antibacterial liquid soap (Dial) on a wet, clean washcloth.  Gently wipe washcloth over affected area.  Afterward, rinse the area with warm water.  Blot the area dry with a soft cloth and cover with antibiotic ointment (neosporin, polysporin, bacitracin) and band aid or gauze and tape  Place 3-4 drops of antibacterial liquid soap in a quart of warm tap water.  Submerge foot into water for 20 minutes.  If bandage was applied after your procedure, leave on to allow for easy lift off, then remove and continue with soak for the remaining time.  Next, blot area dry with a soft cloth and cover with a bandage.  Apply other medications as directed by your doctor, such as cortisporin otic solution (eardrops) or neosporin antibiotic ointment  After washing dry the foot thoroughly apply Iodosorb or Silvadene and gauze dressings as instructed

## 2013-09-21 NOTE — Progress Notes (Signed)
   Subjective:    Patient ID: Pamela Hayes, female    DOB: 03/04/1914, 80 y.o.   MRN: [id number]  HPI '' LT FOOT IS SORE AND HAVE DRAINIGE.''   Review of Systems no new findings or systemic changes noted     Objective:   Physical Exam Neurovascular status is unchanged with thready nonpalpable pulses the right hallux is resolved are improved or continues to have about half centimeter diameter ulceration medial first MTP area of the left foot there is mild macerated hemorrhagic keratoses on the periphery of the ulcer mild serous discharge or drainage noted no bandage no other changes has rigid digital contractures hammertoe deformities as well as bunion deformity. Also thickening restless is mycosis of nails at future visit will need debridement. No signs of secondary infection no ascending psoas lymphangitis noted at this time.       Assessment & Plan:  Assessment ischemic ulcer secondary to arthropathy and neuropathy first MTP area left foot the ulcer is debrided Silvadene gauze dressing are applied and will continue with Silvadene or Iodosorb and gauze dressings daily or every other day as instructed recheck in 3 or 4 weeks for further followup and possible nail debridement at future followup as well  Alvan Dame DPM

## 2013-10-19 ENCOUNTER — Ambulatory Visit (INDEPENDENT_AMBULATORY_CARE_PROVIDER_SITE_OTHER): Payer: Medicare Other

## 2013-10-19 VITALS — BP 114/64 | HR 78 | Temp 97.6°F | Resp 16

## 2013-10-19 DIAGNOSIS — B351 Tinea unguium: Secondary | ICD-10-CM

## 2013-10-19 DIAGNOSIS — L98499 Non-pressure chronic ulcer of skin of other sites with unspecified severity: Secondary | ICD-10-CM

## 2013-10-19 DIAGNOSIS — I70209 Unspecified atherosclerosis of native arteries of extremities, unspecified extremity: Secondary | ICD-10-CM

## 2013-10-19 DIAGNOSIS — M79609 Pain in unspecified limb: Secondary | ICD-10-CM

## 2013-10-19 DIAGNOSIS — M79606 Pain in leg, unspecified: Secondary | ICD-10-CM

## 2013-10-19 DIAGNOSIS — L97509 Non-pressure chronic ulcer of other part of unspecified foot with unspecified severity: Secondary | ICD-10-CM

## 2013-10-19 DIAGNOSIS — I739 Peripheral vascular disease, unspecified: Secondary | ICD-10-CM

## 2013-10-19 DIAGNOSIS — M79676 Pain in unspecified toe(s): Secondary | ICD-10-CM

## 2013-10-19 NOTE — Patient Instructions (Signed)
ANTIBACTERIAL SOAP INSTRUCTIONS  THE DAY AFTER PROCEDURE  Please follow the instructions your doctor has marked.   Shower as usual. Before getting out, place a drop of antibacterial liquid soap (Dial) on a wet, clean washcloth.  Gently wipe washcloth over affected area.  Afterward, rinse the area with warm water.  Blot the area dry with a soft cloth and cover with antibiotic ointment (neosporin, polysporin, bacitracin) and band aid or gauze and tape  Place 3-4 drops of antibacterial liquid soap in a quart of warm tap water.  Submerge foot into water for 20 minutes.  If bandage was applied after your procedure, leave on to allow for easy lift off, then remove and continue with soak for the remaining time.  Next, blot area dry with a soft cloth and cover with a bandage.  Apply other medications as directed by your doctor, such as cortisporin otic solution (eardrops) or neosporin antibiotic ointment   continue with daily cleansing with antibacterial soap and war and applying Silvadene and gauze dressing to the great toe joint left footm water or Epsom salts and water dried thoroughly

## 2013-10-19 NOTE — Progress Notes (Signed)
   Subjective:    Patient ID: Pamela Hayes, female    DOB: Feb 18, 1915, 80 y.o.   MRN: [id number]  HPI Comments: Pt states she's doing good and her caregiver is dressing with the Iodosorb gel daily.     Review of Systems no new findings or systemic changes noted     Objective:   Physical Exam Neurovascular status is unchanged thready nonpalpable pedal pulses DP posse postal for PT nonpalpable bilateral temperature warm to cool turgor diminished there is approximately 0.5 cm diameter ulceration first MTP area left with some hemorrhagic and macerated keratoses which is debrided at this time. Also nails thick brittle crumbly friable dystrophic with discoloration and separation of the nailbed 1 through 5 bilateral. Patient does have notable hammertoe bunion deformities rigid digital contractures is minimally ambulatory and wheelchair no secondary infection is noted at this time.       Assessment & Plan:  Assessment ischemic ulceration secondary to neuropathy and angiopathy first MTP area left foot the ulcer is debrided Silvadene gauze dressing are applied the with Silvadene and gauze dressing daily as instructed or Iodosorb dressing whatever is available. At this time is read on the subcutaneous tissue level Silvadene dressing applied nails 1 through 5 bilateral debridement at this time necrotic friable brittle crumbly nails consistent with onychomycosis are debrided return for future  foot and palliative nail care as needed next  Alvan Dame DPM

## 2013-11-09 ENCOUNTER — Inpatient Hospital Stay (HOSPITAL_COMMUNITY)
Admission: EM | Admit: 2013-11-09 | Discharge: 2013-11-25 | DRG: 444 | Disposition: E | Payer: Medicare Other | Attending: Internal Medicine | Admitting: Internal Medicine

## 2013-11-09 ENCOUNTER — Emergency Department (HOSPITAL_COMMUNITY): Payer: Medicare Other

## 2013-11-09 ENCOUNTER — Encounter (HOSPITAL_COMMUNITY): Payer: Self-pay | Admitting: Emergency Medicine

## 2013-11-09 DIAGNOSIS — E440 Moderate protein-calorie malnutrition: Secondary | ICD-10-CM | POA: Diagnosis present

## 2013-11-09 DIAGNOSIS — K8070 Calculus of gallbladder and bile duct without cholecystitis without obstruction: Secondary | ICD-10-CM

## 2013-11-09 DIAGNOSIS — A419 Sepsis, unspecified organism: Secondary | ICD-10-CM

## 2013-11-09 DIAGNOSIS — R112 Nausea with vomiting, unspecified: Secondary | ICD-10-CM

## 2013-11-09 DIAGNOSIS — G92 Toxic encephalopathy: Secondary | ICD-10-CM | POA: Diagnosis not present

## 2013-11-09 DIAGNOSIS — I10 Essential (primary) hypertension: Secondary | ICD-10-CM | POA: Diagnosis present

## 2013-11-09 DIAGNOSIS — Z950 Presence of cardiac pacemaker: Secondary | ICD-10-CM

## 2013-11-09 DIAGNOSIS — Z9849 Cataract extraction status, unspecified eye: Secondary | ICD-10-CM

## 2013-11-09 DIAGNOSIS — M129 Arthropathy, unspecified: Secondary | ICD-10-CM | POA: Diagnosis present

## 2013-11-09 DIAGNOSIS — I119 Hypertensive heart disease without heart failure: Secondary | ICD-10-CM | POA: Diagnosis present

## 2013-11-09 DIAGNOSIS — I498 Other specified cardiac arrhythmias: Secondary | ICD-10-CM | POA: Diagnosis not present

## 2013-11-09 DIAGNOSIS — Z8249 Family history of ischemic heart disease and other diseases of the circulatory system: Secondary | ICD-10-CM

## 2013-11-09 DIAGNOSIS — Z823 Family history of stroke: Secondary | ICD-10-CM

## 2013-11-09 DIAGNOSIS — K8309 Other cholangitis: Secondary | ICD-10-CM | POA: Diagnosis present

## 2013-11-09 DIAGNOSIS — K8063 Calculus of gallbladder and bile duct with acute cholecystitis with obstruction: Secondary | ICD-10-CM | POA: Diagnosis not present

## 2013-11-09 DIAGNOSIS — K828 Other specified diseases of gallbladder: Secondary | ICD-10-CM

## 2013-11-09 DIAGNOSIS — L84 Corns and callosities: Secondary | ICD-10-CM | POA: Diagnosis present

## 2013-11-09 DIAGNOSIS — R079 Chest pain, unspecified: Secondary | ICD-10-CM | POA: Diagnosis present

## 2013-11-09 DIAGNOSIS — I2510 Atherosclerotic heart disease of native coronary artery without angina pectoris: Secondary | ICD-10-CM | POA: Diagnosis present

## 2013-11-09 DIAGNOSIS — I454 Nonspecific intraventricular block: Secondary | ICD-10-CM | POA: Diagnosis present

## 2013-11-09 DIAGNOSIS — R011 Cardiac murmur, unspecified: Secondary | ICD-10-CM | POA: Diagnosis present

## 2013-11-09 DIAGNOSIS — R64 Cachexia: Secondary | ICD-10-CM

## 2013-11-09 DIAGNOSIS — R404 Transient alteration of awareness: Secondary | ICD-10-CM | POA: Diagnosis not present

## 2013-11-09 DIAGNOSIS — F05 Delirium due to known physiological condition: Secondary | ICD-10-CM

## 2013-11-09 DIAGNOSIS — I739 Peripheral vascular disease, unspecified: Secondary | ICD-10-CM | POA: Diagnosis present

## 2013-11-09 DIAGNOSIS — K810 Acute cholecystitis: Secondary | ICD-10-CM

## 2013-11-09 DIAGNOSIS — R52 Pain, unspecified: Secondary | ICD-10-CM | POA: Diagnosis present

## 2013-11-09 DIAGNOSIS — E785 Hyperlipidemia, unspecified: Secondary | ICD-10-CM

## 2013-11-09 DIAGNOSIS — Z8673 Personal history of transient ischemic attack (TIA), and cerebral infarction without residual deficits: Secondary | ICD-10-CM

## 2013-11-09 DIAGNOSIS — Z515 Encounter for palliative care: Secondary | ICD-10-CM

## 2013-11-09 DIAGNOSIS — Z7982 Long term (current) use of aspirin: Secondary | ICD-10-CM

## 2013-11-09 DIAGNOSIS — IMO0002 Reserved for concepts with insufficient information to code with codable children: Secondary | ICD-10-CM

## 2013-11-09 DIAGNOSIS — G929 Unspecified toxic encephalopathy: Secondary | ICD-10-CM

## 2013-11-09 DIAGNOSIS — I779 Disorder of arteries and arterioles, unspecified: Secondary | ICD-10-CM

## 2013-11-09 DIAGNOSIS — R000 Tachycardia, unspecified: Secondary | ICD-10-CM

## 2013-11-09 DIAGNOSIS — Z86718 Personal history of other venous thrombosis and embolism: Secondary | ICD-10-CM

## 2013-11-09 DIAGNOSIS — K8020 Calculus of gallbladder without cholecystitis without obstruction: Secondary | ICD-10-CM

## 2013-11-09 DIAGNOSIS — Z66 Do not resuscitate: Secondary | ICD-10-CM

## 2013-11-09 DIAGNOSIS — R109 Unspecified abdominal pain: Secondary | ICD-10-CM | POA: Diagnosis present

## 2013-11-09 DIAGNOSIS — Z7902 Long term (current) use of antithrombotics/antiplatelets: Secondary | ICD-10-CM

## 2013-11-09 DIAGNOSIS — K831 Obstruction of bile duct: Secondary | ICD-10-CM

## 2013-11-09 LAB — CBC
HCT: 36.4 % (ref 36.0–46.0)
Hemoglobin: 12.4 g/dL (ref 12.0–15.0)
MCH: 31.4 pg (ref 26.0–34.0)
MCHC: 34.1 g/dL (ref 30.0–36.0)
MCV: 92.2 fL (ref 78.0–100.0)
Platelets: 241 10*3/uL (ref 150–400)
RBC: 3.95 MIL/uL (ref 3.87–5.11)
RDW: 13.2 % (ref 11.5–15.5)
WBC: 7.2 10*3/uL (ref 4.0–10.5)

## 2013-11-09 LAB — URINE MICROSCOPIC-ADD ON

## 2013-11-09 LAB — COMPREHENSIVE METABOLIC PANEL
ALK PHOS: 58 U/L (ref 39–117)
ALT: 8 U/L (ref 0–35)
AST: 18 U/L (ref 0–37)
Albumin: 3.5 g/dL (ref 3.5–5.2)
Anion gap: 15 (ref 5–15)
BILIRUBIN TOTAL: 0.2 mg/dL — AB (ref 0.3–1.2)
BUN: 16 mg/dL (ref 6–23)
CHLORIDE: 102 meq/L (ref 96–112)
CO2: 24 mEq/L (ref 19–32)
Calcium: 9.4 mg/dL (ref 8.4–10.5)
Creatinine, Ser: 0.76 mg/dL (ref 0.50–1.10)
GFR calc Af Amer: 79 mL/min — ABNORMAL LOW (ref >90)
GFR calc non Af Amer: 68 mL/min — ABNORMAL LOW (ref >90)
GLUCOSE: 130 mg/dL — AB (ref 70–99)
Potassium: 4.3 mEq/L (ref 3.7–5.3)
Sodium: 141 mEq/L (ref 137–147)
Total Protein: 7.2 g/dL (ref 6.0–8.3)

## 2013-11-09 LAB — URINALYSIS, ROUTINE W REFLEX MICROSCOPIC
Bilirubin Urine: NEGATIVE
GLUCOSE, UA: NEGATIVE mg/dL
Hgb urine dipstick: NEGATIVE
KETONES UR: NEGATIVE mg/dL
NITRITE: NEGATIVE
Protein, ur: 30 mg/dL — AB
Specific Gravity, Urine: 1.019 (ref 1.005–1.030)
Urobilinogen, UA: 1 mg/dL (ref 0.0–1.0)
pH: 7 (ref 5.0–8.0)

## 2013-11-09 LAB — BASIC METABOLIC PANEL
ANION GAP: 14 (ref 5–15)
BUN: 17 mg/dL (ref 6–23)
CALCIUM: 9.4 mg/dL (ref 8.4–10.5)
CO2: 25 mEq/L (ref 19–32)
CREATININE: 0.74 mg/dL (ref 0.50–1.10)
Chloride: 104 mEq/L (ref 96–112)
GFR calc Af Amer: 80 mL/min — ABNORMAL LOW (ref >90)
GFR, EST NON AFRICAN AMERICAN: 69 mL/min — AB (ref >90)
Glucose, Bld: 131 mg/dL — ABNORMAL HIGH (ref 70–99)
Potassium: 4.3 mEq/L (ref 3.7–5.3)
Sodium: 143 mEq/L (ref 137–147)

## 2013-11-09 LAB — LIPASE, BLOOD: LIPASE: 30 U/L (ref 11–59)

## 2013-11-09 LAB — PRO B NATRIURETIC PEPTIDE: Pro B Natriuretic peptide (BNP): 850.9 pg/mL — ABNORMAL HIGH (ref 0–450)

## 2013-11-09 LAB — POC OCCULT BLOOD, ED: FECAL OCCULT BLD: NEGATIVE

## 2013-11-09 LAB — I-STAT TROPONIN, ED: Troponin i, poc: 0.01 ng/mL (ref 0.00–0.08)

## 2013-11-09 LAB — PROTIME-INR
INR: 1.03 (ref 0.00–1.49)
Prothrombin Time: 13.5 seconds (ref 11.6–15.2)

## 2013-11-09 MED ORDER — MORPHINE SULFATE 2 MG/ML IJ SOLN
2.0000 mg | Freq: Once | INTRAMUSCULAR | Status: AC
  Administered 2013-11-09: 2 mg via INTRAVENOUS
  Filled 2013-11-09: qty 1

## 2013-11-09 MED ORDER — NITROGLYCERIN 0.4 MG SL SUBL
0.4000 mg | SUBLINGUAL_TABLET | SUBLINGUAL | Status: DC | PRN
  Administered 2013-11-09 (×2): 0.4 mg via SUBLINGUAL
  Filled 2013-11-09: qty 1

## 2013-11-09 MED ORDER — VANCOMYCIN HCL IN DEXTROSE 1-5 GM/200ML-% IV SOLN
1000.0000 mg | Freq: Once | INTRAVENOUS | Status: AC
  Administered 2013-11-09: 1000 mg via INTRAVENOUS
  Filled 2013-11-09: qty 200

## 2013-11-09 MED ORDER — PIPERACILLIN-TAZOBACTAM 3.375 G IVPB
3.3750 g | Freq: Once | INTRAVENOUS | Status: AC
  Administered 2013-11-10: 3.375 g via INTRAVENOUS
  Filled 2013-11-09: qty 50

## 2013-11-09 MED ORDER — ONDANSETRON HCL 4 MG/2ML IJ SOLN
4.0000 mg | Freq: Once | INTRAMUSCULAR | Status: AC
  Administered 2013-11-09: 4 mg via INTRAVENOUS
  Filled 2013-11-09: qty 2

## 2013-11-09 MED ORDER — SODIUM CHLORIDE 0.9 % IV BOLUS (SEPSIS)
1000.0000 mL | Freq: Once | INTRAVENOUS | Status: AC
  Administered 2013-11-09: 1000 mL via INTRAVENOUS

## 2013-11-09 MED ORDER — IOHEXOL 300 MG/ML  SOLN: 25.0000 mL | INTRAMUSCULAR | Status: AC

## 2013-11-09 NOTE — ED Provider Notes (Signed)
CSN: [id number]     Arrival date & time 11/16/2013  2019 History   First MD Initiated Contact with Patient 11/19/2013 2102     Chief Complaint  Patient presents with  . Chest Pain     (Consider location/radiation/quality/duration/timing/severity/associated sxs/prior Treatment) HPI This is a 80 year old female who comes in today complaining of nausea and vomiting with upper abdominal pain that began sometime during the day today. She never eats very much but has not had any appetite today. She began vomiting this afternoon and has had repeated episodes of emesis appear these have been non-bilious and nonbloody. She has been having normal bowel movements. She has been complaining of upper normal pain and moaning. Her daughters are present with her and most of the history is obtained from her daughters. They report no prior abdominal surgeries. She has not had fever or chills. Past Medical History  Diagnosis Date  . Heart murmur   . Coronary artery disease   . Hypertension   . High cholesterol   . LVH (left ventricular hypertrophy) due to hypertensive disease   . DVT (deep venous thrombosis)     "right" (12/04/2012)  . Exertional shortness of breath   . TIA (transient ischemic attack)     "she's had several; all awhile back" (12/04/2012)  . Arthritis     "joints" (12/04/2012)  . PVD (peripheral vascular disease)   . Tachycardia 12/05/2012  . Arterial disease    Past Surgical History  Procedure Laterality Date  . Cataract extraction w/ intraocular lens  implant, bilateral Bilateral    Family History  Problem Relation Age of Onset  . Stroke Mother   . Heart disease Father    History  Substance Use Topics  . Smoking status: Never Smoker   . Smokeless tobacco: Never Used  . Alcohol Use: No   OB History   Grav Para Term Preterm Abortions TAB SAB Ect Mult Living                 Review of Systems  All other systems reviewed and are negative.     Allergies  Review of  patient's allergies indicates no known allergies.  Home Medications   Prior to Admission medications   Medication Sig Start Date End Date Taking? Authorizing Provider  acetaminophen (TYLENOL) 325 MG tablet Take 650 mg by mouth every 6 (six) hours as needed.   Yes Historical Provider, MD  aspirin EC 81 MG tablet Take 81 mg by mouth daily.   Yes Historical Provider, MD  atenolol (TENORMIN) 50 MG tablet Take 25 mg by mouth daily.   Yes Historical Provider, MD  brimonidine (ALPHAGAN) 0.2 % ophthalmic solution Place 1 drop into both eyes 2 (two) times daily.  11/10/12  Yes Historical Provider, MD  calcium carbonate (OS-CAL - DOSED IN MG OF ELEMENTAL CALCIUM) 1250 MG tablet Take 2 tablets by mouth daily with breakfast.   Yes Historical Provider, MD  clopidogrel (PLAVIX) 75 MG tablet Take 75 mg by mouth daily.   Yes Historical Provider, MD  dorzolamide-timolol (COSOPT) 22.3-6.8 MG/ML ophthalmic solution Place 1 drop into both eyes 2 (two) times daily.   Yes Historical Provider, MD  PARoxetine (PAXIL) 40 MG tablet Take 40 mg by mouth every morning.   Yes Historical Provider, MD  polyethylene glycol (MIRALAX / GLYCOLAX) packet Take 17 g by mouth daily as needed. 12/05/12  Yes Nada Boozer, NP  pravastatin (PRAVACHOL) 40 MG tablet Take 40 mg by mouth daily.   Yes Historical  Provider, MD  risedronate (ACTONEL) 35 MG tablet Take 35 mg by mouth every 7 (seven) days. On Saturday    -   with water on empty stomach, nothing by mouth or lie down for next 30 minutes.   Yes Historical Provider, MD  SANTYL ointment Apply 1 application topically every 3 (three) days. 05/07/13  Yes Historical Provider, MD  verapamil (VERELAN PM) 180 MG 24 hr capsule Take 180 mg by mouth daily.   Yes Historical Provider, MD   BP 128/76  Pulse 101  Temp(Src) 97.5 F (36.4 C) (Oral)  Resp 19  SpO2 98% Physical Exam  Nursing note and vitals reviewed. Constitutional: She appears well-developed and well-nourished. She appears  distressed.  HENT:  Head: Normocephalic and atraumatic.  Right Ear: External ear normal.  Mouth/Throat: Oropharynx is clear and moist.  Eyes: Conjunctivae are normal. Pupils are equal, round, and reactive to light.  Neck: Normal range of motion. Neck supple.  Cardiovascular: Tachycardia present.   Pulmonary/Chest: Effort normal. No respiratory distress.  Abdominal: She exhibits distension and mass. There is tenderness. There is no rebound and no guarding.      ED Course  Procedures (including critical care time) Labs Review Labs Reviewed  BASIC METABOLIC PANEL - Abnormal; Notable for the following:    Glucose, Bld 131 (*)    GFR calc non Af Amer 69 (*)    GFR calc Af Amer 80 (*)    All other components within normal limits  PRO B NATRIURETIC PEPTIDE - Abnormal; Notable for the following:    Pro B Natriuretic peptide (BNP) 850.9 (*)    All other components within normal limits  COMPREHENSIVE METABOLIC PANEL - Abnormal; Notable for the following:    Glucose, Bld 130 (*)    Total Bilirubin 0.2 (*)    GFR calc non Af Amer 68 (*)    GFR calc Af Amer 79 (*)    All other components within normal limits  URINALYSIS, ROUTINE W REFLEX MICROSCOPIC - Abnormal; Notable for the following:    APPearance CLOUDY (*)    Protein, ur 30 (*)    Leukocytes, UA SMALL (*)    All other components within normal limits  URINE MICROSCOPIC-ADD ON - Abnormal; Notable for the following:    Bacteria, UA MANY (*)    Crystals CA OXALATE CRYSTALS (*)    All other components within normal limits  CBC  PROTIME-INR  LIPASE, BLOOD  OCCULT BLOOD X 1 CARD TO LAB, STOOL  I-STAT TROPOININ, ED  POC OCCULT BLOOD, ED    Imaging Review Dg Chest Port 1 View  11/11/2013   CLINICAL DATA:  Chest pain  EXAM: PORTABLE CHEST - 1 VIEW  COMPARISON:  12/04/2012  FINDINGS: Normal heart size. Aortic arch remains tortuous. Left basilar atelectasis for scar is stable. Lungs otherwise clear. No pneumothorax or pleural  effusion.  IMPRESSION: Chronic left basilar atelectasis for scar. No active cardiopulmonary disease.   Electronically Signed   By: Maryclare Bean M.D.   On: 11/18/2013 21:26     EKG Interpretation   Date/Time:  Tuesday November 09 2013 20:27:52 EDT Ventricular Rate:  93 PR Interval:  244 QRS Duration: 111 QT Interval:  393 QTC Calculation: 489 R Axis:   -55 Text Interpretation:  Normal sinus rhythm Non-specific ST-t changes  unchanged from ekg of 05 December 2012 Confirmed by Valerye Kobus MD, Duwayne Heck  [phone]) on 11/18/2013 9:06:44 PM      MDM   Final diagnoses:  Abdominal pain, acute  Essential hypertension  Hyperlipidemia  Nausea and vomiting, vomiting of unspecified type  Cachexia  Biliary tract obstruction  Gallstone  Cholelithiasis with choledocholithiasis  Dilated gallbladder      11:48 PM Patient feels improved after bowel movement.  She has received several doses of pain medicine.  PE reveals some ongoing epigastric and upper abdominal pain.  [place] and ct are pending.  Care has been discussed with Drs. Lovell Sheehan and Janee Morn.   Hilario Quarry, MD Nov 28, 2013 [phone]

## 2013-11-09 NOTE — ED Notes (Addendum)
Pt presents to ED via EMS with c/o chest pain associated with nausea, vomiting, and diaphoresis, onset this evening. Pt also c/o abdominal pain. Per EMS, EKG unremarkable, BP-142/83, HR-89, SpO2-100% on room air, CBG-125, given 4mg  zofran. Pt refuses nitro. Per Family, pt given 324mg  ASA.

## 2013-11-10 ENCOUNTER — Encounter (HOSPITAL_COMMUNITY): Payer: Self-pay

## 2013-11-10 ENCOUNTER — Observation Stay (HOSPITAL_COMMUNITY): Payer: Medicare Other

## 2013-11-10 DIAGNOSIS — I10 Essential (primary) hypertension: Secondary | ICD-10-CM

## 2013-11-10 DIAGNOSIS — K831 Obstruction of bile duct: Secondary | ICD-10-CM

## 2013-11-10 DIAGNOSIS — E440 Moderate protein-calorie malnutrition: Secondary | ICD-10-CM | POA: Diagnosis present

## 2013-11-10 DIAGNOSIS — K8020 Calculus of gallbladder without cholecystitis without obstruction: Secondary | ICD-10-CM

## 2013-11-10 DIAGNOSIS — R109 Unspecified abdominal pain: Secondary | ICD-10-CM | POA: Diagnosis present

## 2013-11-10 DIAGNOSIS — K828 Other specified diseases of gallbladder: Secondary | ICD-10-CM

## 2013-11-10 DIAGNOSIS — E785 Hyperlipidemia, unspecified: Secondary | ICD-10-CM

## 2013-11-10 DIAGNOSIS — R64 Cachexia: Secondary | ICD-10-CM

## 2013-11-10 DIAGNOSIS — R112 Nausea with vomiting, unspecified: Secondary | ICD-10-CM | POA: Diagnosis present

## 2013-11-10 DIAGNOSIS — K8070 Calculus of gallbladder and bile duct without cholecystitis without obstruction: Secondary | ICD-10-CM | POA: Diagnosis present

## 2013-11-10 DIAGNOSIS — K810 Acute cholecystitis: Secondary | ICD-10-CM | POA: Diagnosis present

## 2013-11-10 LAB — BASIC METABOLIC PANEL
Anion gap: 17 — ABNORMAL HIGH (ref 5–15)
BUN: 13 mg/dL (ref 6–23)
CO2: 21 mEq/L (ref 19–32)
Calcium: 8.6 mg/dL (ref 8.4–10.5)
Chloride: 102 mEq/L (ref 96–112)
Creatinine, Ser: 0.63 mg/dL (ref 0.50–1.10)
GFR, EST AFRICAN AMERICAN: 84 mL/min — AB (ref >90)
GFR, EST NON AFRICAN AMERICAN: 73 mL/min — AB (ref >90)
Glucose, Bld: 125 mg/dL — ABNORMAL HIGH (ref 70–99)
Potassium: 3.3 mEq/L — ABNORMAL LOW (ref 3.7–5.3)
SODIUM: 140 meq/L (ref 137–147)

## 2013-11-10 LAB — CBC
HCT: 34.6 % — ABNORMAL LOW (ref 36.0–46.0)
Hemoglobin: 11.9 g/dL — ABNORMAL LOW (ref 12.0–15.0)
MCH: 31.5 pg (ref 26.0–34.0)
MCHC: 34.4 g/dL (ref 30.0–36.0)
MCV: 91.5 fL (ref 78.0–100.0)
PLATELETS: 228 10*3/uL (ref 150–400)
RBC: 3.78 MIL/uL — AB (ref 3.87–5.11)
RDW: 13.2 % (ref 11.5–15.5)
WBC: 9.4 10*3/uL (ref 4.0–10.5)

## 2013-11-10 MED ORDER — ASPIRIN EC 81 MG PO TBEC
81.0000 mg | DELAYED_RELEASE_TABLET | Freq: Every day | ORAL | Status: DC
  Administered 2013-11-10: 81 mg via ORAL
  Filled 2013-11-10 (×7): qty 1

## 2013-11-10 MED ORDER — SODIUM CHLORIDE 0.9 % IV SOLN
3.0000 g | Freq: Three times a day (TID) | INTRAVENOUS | Status: DC
  Administered 2013-11-11 (×2): 3 g via INTRAVENOUS
  Filled 2013-11-10 (×4): qty 3

## 2013-11-10 MED ORDER — PRAVASTATIN SODIUM 40 MG PO TABS
40.0000 mg | ORAL_TABLET | Freq: Every day | ORAL | Status: DC
  Administered 2013-11-10: 40 mg via ORAL
  Filled 2013-11-10 (×5): qty 1

## 2013-11-10 MED ORDER — POTASSIUM CHLORIDE 10 MEQ/100ML IV SOLN
10.0000 meq | INTRAVENOUS | Status: DC
  Filled 2013-11-10 (×4): qty 100

## 2013-11-10 MED ORDER — DORZOLAMIDE HCL-TIMOLOL MAL 2-0.5 % OP SOLN
1.0000 [drp] | Freq: Two times a day (BID) | OPHTHALMIC | Status: DC
  Administered 2013-11-10 – 2013-11-14 (×8): 1 [drp] via OPHTHALMIC
  Filled 2013-11-10 (×2): qty 10

## 2013-11-10 MED ORDER — CLOPIDOGREL BISULFATE 75 MG PO TABS
75.0000 mg | ORAL_TABLET | Freq: Every day | ORAL | Status: DC
  Administered 2013-11-10: 75 mg via ORAL
  Filled 2013-11-10: qty 1

## 2013-11-10 MED ORDER — ACETAMINOPHEN 650 MG RE SUPP
650.0000 mg | Freq: Four times a day (QID) | RECTAL | Status: DC | PRN
  Filled 2013-11-10: qty 1

## 2013-11-10 MED ORDER — HYDROMORPHONE HCL PF 1 MG/ML IJ SOLN: 0.5000 mg | INTRAMUSCULAR | Status: DC | PRN

## 2013-11-10 MED ORDER — MORPHINE SULFATE 2 MG/ML IJ SOLN
1.0000 mg | INTRAMUSCULAR | Status: DC | PRN
  Administered 2013-11-10: 2 mg via INTRAVENOUS
  Administered 2013-11-10: 1 mg via INTRAVENOUS
  Administered 2013-11-11 (×2): 2 mg via INTRAVENOUS
  Administered 2013-11-11: 1 mg via INTRAVENOUS
  Filled 2013-11-10 (×5): qty 1

## 2013-11-10 MED ORDER — POTASSIUM CHLORIDE 20 MEQ/15ML (10%) PO LIQD
40.0000 meq | Freq: Once | ORAL | Status: DC
  Filled 2013-11-10: qty 30

## 2013-11-10 MED ORDER — VERAPAMIL HCL ER 180 MG PO TBCR
180.0000 mg | EXTENDED_RELEASE_TABLET | Freq: Every day | ORAL | Status: DC
  Administered 2013-11-10 – 2013-11-13 (×2): 180 mg via ORAL
  Filled 2013-11-10 (×6): qty 1

## 2013-11-10 MED ORDER — ACETAMINOPHEN 325 MG PO TABS
650.0000 mg | ORAL_TABLET | Freq: Four times a day (QID) | ORAL | Status: DC | PRN
  Administered 2013-11-10 – 2013-11-13 (×2): 650 mg via ORAL
  Filled 2013-11-10 (×2): qty 2

## 2013-11-10 MED ORDER — ONDANSETRON HCL 4 MG/2ML IJ SOLN: 4.0000 mg | Freq: Four times a day (QID) | INTRAMUSCULAR | Status: DC | PRN

## 2013-11-10 MED ORDER — ATENOLOL 25 MG PO TABS
25.0000 mg | ORAL_TABLET | Freq: Every day | ORAL | Status: DC
  Administered 2013-11-10 – 2013-11-12 (×2): 25 mg via ORAL
  Filled 2013-11-10 (×3): qty 1

## 2013-11-10 MED ORDER — MORPHINE SULFATE 2 MG/ML IJ SOLN
1.0000 mg | INTRAMUSCULAR | Status: DC | PRN
  Administered 2013-11-10: 1 mg via INTRAVENOUS
  Filled 2013-11-10: qty 1

## 2013-11-10 MED ORDER — SIMVASTATIN 20 MG PO TABS
20.0000 mg | ORAL_TABLET | Freq: Every day | ORAL | Status: DC
  Filled 2013-11-10: qty 1

## 2013-11-10 MED ORDER — DEXTROSE 5 % IV SOLN
1.0000 g | INTRAVENOUS | Status: DC
  Administered 2013-11-10: 1 g via INTRAVENOUS
  Filled 2013-11-10: qty 10

## 2013-11-10 MED ORDER — CALCIUM CARBONATE 1250 (500 CA) MG PO TABS
2.0000 | ORAL_TABLET | Freq: Every day | ORAL | Status: DC
  Filled 2013-11-10 (×6): qty 2

## 2013-11-10 MED ORDER — ALUM & MAG HYDROXIDE-SIMETH 200-200-20 MG/5ML PO SUSP: 30.0000 mL | Freq: Four times a day (QID) | ORAL | Status: DC | PRN

## 2013-11-10 MED ORDER — POTASSIUM CHLORIDE 10 MEQ/100ML IV SOLN
10.0000 meq | INTRAVENOUS | Status: AC
  Administered 2013-11-10 – 2013-11-11 (×4): 10 meq via INTRAVENOUS
  Filled 2013-11-10 (×4): qty 100

## 2013-11-10 MED ORDER — BOOST / RESOURCE BREEZE PO LIQD: 1.0000 | Freq: Three times a day (TID) | ORAL | Status: DC

## 2013-11-10 MED ORDER — OXYCODONE HCL 5 MG PO TABS: 5.0000 mg | ORAL_TABLET | ORAL | Status: DC | PRN

## 2013-11-10 MED ORDER — SODIUM CHLORIDE 0.9 % IV SOLN
INTRAVENOUS | Status: AC
  Administered 2013-11-12 (×2): via INTRAVENOUS

## 2013-11-10 MED ORDER — BRIMONIDINE TARTRATE 0.2 % OP SOLN
1.0000 [drp] | Freq: Two times a day (BID) | OPHTHALMIC | Status: DC
  Administered 2013-11-10 – 2013-11-14 (×8): 1 [drp] via OPHTHALMIC
  Filled 2013-11-10 (×2): qty 5

## 2013-11-10 MED ORDER — ENOXAPARIN SODIUM 30 MG/0.3ML ~~LOC~~ SOLN
20.0000 mg | SUBCUTANEOUS | Status: DC
  Administered 2013-11-10: 20 mg via SUBCUTANEOUS
  Filled 2013-11-10 (×7): qty 0.2

## 2013-11-10 MED ORDER — CALCIUM CARBONATE 1250 (500 CA) MG PO TABS
2.0000 | ORAL_TABLET | Freq: Every day | ORAL | Status: DC
  Administered 2013-11-10: 1000 mg via ORAL
  Filled 2013-11-10 (×2): qty 2

## 2013-11-10 MED ORDER — ONDANSETRON HCL 4 MG PO TABS: 4.0000 mg | ORAL_TABLET | Freq: Four times a day (QID) | ORAL | Status: DC | PRN

## 2013-11-10 MED ORDER — IOHEXOL 300 MG/ML  SOLN
100.0000 mL | Freq: Once | INTRAMUSCULAR | Status: AC | PRN
  Administered 2013-11-10: 80 mL via INTRAVENOUS

## 2013-11-10 MED ORDER — POLYETHYLENE GLYCOL 3350 17 G PO PACK
17.0000 g | PACK | Freq: Every day | ORAL | Status: DC
  Administered 2013-11-10: 17 g via ORAL
  Filled 2013-11-10 (×6): qty 1

## 2013-11-10 NOTE — Consult Note (Signed)
Referring Provider: [facility] Primary Care Physician:  Georgianne Fick, MD Primary Gastroenterologist:  unassigned  Reason for Consultation:   Dilated intra and extrahepatic ducts with choledocholithiasis  HPI: Pamela Hayes is a 80 y.o. female admitted through ER 10/30/2013 due to severe RUQ and epigastric pain. She has a history of CAD, hyperlipidemia, HTN, PVD, and previous DVT of RLE.  The pt is a poor historian and history is obtained from her daughter. Pt had some ensure around 11:00 a.m. Yesterday, and around 6 pm began to complain of severe RUQ and epigastric abd pain.  She felt nauseous but did not vomit. She had no fever, chills or diarrhea. She had an abdominal CT in ER that revealed a distended gallbladder with numerous stones, and dilated intra and extrahepatic biliary ducts with choledocholithiasis. Distal common duct stricture cannot be entirely excluded but is not definitely identified. There is associated dilatation of the pancreatic duct.   Past Medical History  Diagnosis Date  . Heart murmur   . Coronary artery disease   . Hypertension   . High cholesterol   . LVH (left ventricular hypertrophy) due to hypertensive disease   . DVT (deep venous thrombosis)     "right" (12/04/2012)  . Exertional shortness of breath   . TIA (transient ischemic attack)     "she's had several; all awhile back" (12/04/2012)  . Arthritis     "joints" (12/04/2012)  . PVD (peripheral vascular disease)   . Tachycardia 12/05/2012  . Arterial disease     Past Surgical History  Procedure Laterality Date  . Cataract extraction w/ intraocular lens  implant, bilateral Bilateral     Prior to Admission medications   Medication Sig Start Date End Date Taking? Authorizing Provider  acetaminophen (TYLENOL) 325 MG tablet Take 650 mg by mouth every 6 (six) hours as needed.   Yes Historical Provider, MD  aspirin EC 81 MG tablet Take 81 mg by mouth daily.   Yes Historical Provider, MD    atenolol (TENORMIN) 50 MG tablet Take 25 mg by mouth daily.   Yes Historical Provider, MD  brimonidine (ALPHAGAN) 0.2 % ophthalmic solution Place 1 drop into both eyes 2 (two) times daily.  11/10/12  Yes Historical Provider, MD  calcium carbonate (OS-CAL - DOSED IN MG OF ELEMENTAL CALCIUM) 1250 MG tablet Take 2 tablets by mouth daily with breakfast.   Yes Historical Provider, MD  clopidogrel (PLAVIX) 75 MG tablet Take 75 mg by mouth daily.   Yes Historical Provider, MD  dorzolamide-timolol (COSOPT) 22.3-6.8 MG/ML ophthalmic solution Place 1 drop into both eyes 2 (two) times daily.   Yes Historical Provider, MD  PARoxetine (PAXIL) 40 MG tablet Take 40 mg by mouth every morning.   Yes Historical Provider, MD  polyethylene glycol (MIRALAX / GLYCOLAX) packet Take 17 g by mouth daily as needed. 12/05/12  Yes Nada Boozer, NP  pravastatin (PRAVACHOL) 40 MG tablet Take 40 mg by mouth daily.   Yes Historical Provider, MD  risedronate (ACTONEL) 35 MG tablet Take 35 mg by mouth every 7 (seven) days. On Saturday    -   with water on empty stomach, nothing by mouth or lie down for next 30 minutes.   Yes Historical Provider, MD  SANTYL ointment Apply 1 application topically every 3 (three) days. 05/07/13  Yes Historical Provider, MD  verapamil (VERELAN PM) 180 MG 24 hr capsule Take 180 mg by mouth daily.   Yes Historical Provider, MD    Current Facility-Administered Medications  Medication Dose Route Frequency Provider Last Rate Last Dose  . 0.9 %  sodium chloride infusion   Intravenous Continuous Ron Parker, MD      . acetaminophen (TYLENOL) tablet 650 mg  650 mg Oral Q6H PRN Ron Parker, MD   650 mg at 11/10/13 4098   Or  . acetaminophen (TYLENOL) suppository 650 mg  650 mg Rectal Q6H PRN Ron Parker, MD      . alum & mag hydroxide-simeth (MAALOX/MYLANTA) 200-200-20 MG/5ML suspension 30 mL  30 mL Oral Q6H PRN Ron Parker, MD      . aspirin EC tablet 81 mg  81 mg Oral Daily  Ron Parker, MD   81 mg at 11/10/13 1150  . atenolol (TENORMIN) tablet 25 mg  25 mg Oral Daily Ron Parker, MD   25 mg at 11/10/13 1151  . brimonidine (ALPHAGAN) 0.2 % ophthalmic solution 1 drop  1 drop Both Eyes BID Ron Parker, MD   1 drop at 11/10/13 1154  . calcium carbonate (OS-CAL - dosed in mg of elemental calcium) tablet 1,000 mg of elemental calcium  2 tablet Oral Q breakfast Ron Parker, MD   1,000 mg of elemental calcium at 11/10/13 0810  . cefTRIAXone (ROCEPHIN) 1 g in dextrose 5 % 50 mL IVPB  1 g Intravenous Q24H Megan N Dort, PA-C      . clopidogrel (PLAVIX) tablet 75 mg  75 mg Oral Daily Ron Parker, MD   75 mg at 11/10/13 1150  . dorzolamide-timolol (COSOPT) 22.3-6.8 MG/ML ophthalmic solution 1 drop  1 drop Both Eyes BID Ron Parker, MD   1 drop at 11/10/13 1152  . enoxaparin (LOVENOX) injection 20 mg  20 mg Subcutaneous Q24H Ron Parker, MD   20 mg at 11/10/13 1154  . morphine 2 MG/ML injection 1-2 mg  1-2 mg Intravenous Q4H PRN Jerald Kief, MD      . nitroGLYCERIN (NITROSTAT) SL tablet 0.4 mg  0.4 mg Sublingual Q5 min PRN Hilario Quarry, MD   0.4 mg at 11/06/2013 2102  . ondansetron (ZOFRAN) tablet 4 mg  4 mg Oral Q6H PRN Ron Parker, MD       Or  . ondansetron (ZOFRAN) injection 4 mg  4 mg Intravenous Q6H PRN Ron Parker, MD      . oxyCODONE (Oxy IR/ROXICODONE) immediate release tablet 5 mg  5 mg Oral Q4H PRN Ron Parker, MD      . polyethylene glycol (MIRALAX / GLYCOLAX) packet 17 g  17 g Oral Daily Ron Parker, MD   17 g at 11/10/13 1152  . potassium chloride 20 MEQ/15ML (10%) liquid 40 mEq  40 mEq Oral Once Jerald Kief, MD      . pravastatin (PRAVACHOL) tablet 40 mg  40 mg Oral q1800 Jerald Kief, MD      . verapamil (CALAN-SR) CR tablet 180 mg  180 mg Oral Daily Ron Parker, MD   180 mg at 11/10/13 1152    Allergies as of 11/01/2013  . (No Known Allergies)    Family History    Problem Relation Age of Onset  . Stroke Mother   . Heart disease Father     History   Social History  . Marital Status: Widowed    Spouse Name: N/A    Number of Children: N/A  . Years of Education: N/A   Occupational History  . Not on  file.   Social History Main Topics  . Smoking status: Never Smoker   . Smokeless tobacco: Never Used  . Alcohol Use: No  . Drug Use: No  . Sexual Activity: No   Other Topics Concern  . Not on file   Social History Narrative  . No narrative on file    Review of Systems:(pt answers some questions and some are answered by her daughter) Gen: Denies any fever, chills, sweats,  fatigue, weakness, malaise, and sleep disorder. Daughter says pts appetite has been decreased for a few weeks, and that pt has been losing wt. CV: Denies chest pain, angina, palpitations, syncope, orthopnea, PND, peripheral edema, and claudication. Resp: Denies dyspnea at rest, dyspnea with exercise, cough, sputum, wheezing, coughing up blood, and pleurisy. GI: Denies vomiting blood, jaundice, and fecal incontinence.   Denies dysphagia or odynophagia. GU : Denies urinary burning, blood in urine, urinary frequency, urinary hesitancy, nocturnal urination, and urinary incontinence. MS: Denies joint pain, limitation of movement, and swelling, stiffness, low back pain, extremity pain. Denies muscle weakness, cramps, atrophy.  Derm: Denies rash, itching, dry skin, hives, moles, warts, or unhealing ulcers.  Psych: Denies depression, anxiety, memory loss, suicidal ideation, hallucinations, paranoia, and confusion. Heme: Denies bruising, bleeding, and enlarged lymph nodes. Neuro:  Denies any headaches, dizziness, paresthesias. Endo:  Denies any problems with DM, thyroid, adrenal function.  Physical Exam: Vital signs in last 24 hours: Temp:  [97.5 F (36.4 C)-98.4 F (36.9 C)] 97.7 F (36.5 C) (09/16 0845) Pulse Rate:  [92-109] 105 (09/16 1145) Resp:  [14-26] 19 (09/16  1145) BP: (122-198)/(63-98) 132/67 mmHg (09/16 1145) SpO2:  [92 %-100 %] 100 % (09/16 1145) Weight:  [86 lb 9.6 oz (39.282 kg)] 86 lb 9.6 oz (39.282 kg) (09/16 0146) Last BM Date: 01-Dec-2013 General:   Alert, frail, elderly, AA female, pleasant and cooperative in NAD Head:  Normocephalic and atraumatic. Eyes:  Sclera clear, no icterus.   Conjunctiva pink. Ears:  Normal auditory acuity. Nose:  No deformity, discharge,  or lesions. Mouth:  No deformity or lesions.   Neck:  Supple; no masses or thyromegaly. Lungs:  Clear throughout to auscultation.   No wheezes, crackles, or rhonchi  Heart:  Regular rate and rhythm; no murmurs Abdomen:  Soft,tender RUQ and epigastric area, BS active,nonpalp mass or hsm.   Rectal:  Deferred  Msk:  Symmetrical without gross deformities. . Pulses:  Normal pulses noted. Extremities:  Without clubbing or edema. Neurologic:  Alert and  oriented Skin:  Intact without significant lesions or rashes.. Psych:  Alert and cooperative. Normal mood and affect.  Intake/Output from previous day: 09/15 0701 - 09/16 0700 In: 0  Out: 1 [Urine:1] Intake/Output this shift: Total I/O In: 240 [P.O.:240] Out: 1 [Urine:1]  Lab Results:  Recent Labs  December 01, 2013 2103 11/10/13 0321  WBC 7.2 9.4  HGB 12.4 11.9*  HCT 36.4 34.6*  PLT 241 228   BMET  Recent Labs  Dec 01, 2013 2103 11/10/13 0321  NA 143  141 140  K 4.3  4.3 3.3*  CL 104  102 102  CO2 25  24 21   GLUCOSE 131*  130* 125*  BUN 17  16 13   CREATININE 0.74  0.76 0.63  CALCIUM 9.4  9.4 8.6   LFT  Recent Labs  2013/12/01 2103  PROT 7.2  ALBUMIN 3.5  AST 18  ALT 8  ALKPHOS 58  BILITOT 0.2*   PT/INR  INR 1.03 on 12/01/2013  Recent Labs  12-01-13 2103  LABPROT  13.5  INR 1.03     Studies/Results: Dg Abd 1 View  11/21/2013   CLINICAL DATA:  Abdominal pain.  EXAM: ABDOMEN - 1 VIEW  COMPARISON:  Abdominal radiograph performed 12/21/2008, and CT of the abdomen and pelvis from 12/11/2009   FINDINGS: The visualized bowel gas pattern is unremarkable. Scattered air filled loops of colon are seen; no abnormal dilatation of small bowel loops is seen to suggest small bowel obstruction. No free intra-abdominal air is identified, though evaluation for free air is limited on a single supine view.  The visualized osseous structures are within normal limits; the sacroiliac joints are unremarkable in appearance. Mild right convex lumbar scoliosis is noted, with mild degenerative change at the lower lumbar spine. Scattered vascular calcifications are seen. The visualized lung bases are essentially clear.  IMPRESSION: 1. Unremarkable bowel gas pattern; no free intra-abdominal air seen. 2. Mild right convex lumbar scoliosis noted. 3. Scattered vascular calcifications seen.   Electronically Signed   By: Roanna Raider M.D.   On: 11/02/2013 22:23   Ct Abdomen Pelvis W Contrast  11/10/2013   CLINICAL DATA:  Nausea and vomiting.  Diffuse abdominal pain.  EXAM: CT ABDOMEN AND PELVIS WITH CONTRAST  TECHNIQUE: Multidetector CT imaging of the abdomen and pelvis was performed using the standard protocol following bolus administration of intravenous contrast.  CONTRAST:  58mL OMNIPAQUE IOHEXOL 300 MG/ML  SOLN  COMPARISON:  CT of the abdomen and pelvis on 12/11/2009  FINDINGS: Lower chest: The heart is enlarged. Dense coronary artery calcification is present.  Upper abdomen: There is marked dilatation of the intrahepatic and extrahepatic bile ducts. The gallbladder is distended and contains numerous stones. Hyperdense oval masses are also identified within the common bile duct measuring up to 1.5 cm, likely representing obstructing gallstones. The pancreatic duct is also dilated. Within the gallbladder, numerous septations are identified. This raises question of gallbladder wall edema. Further evaluation with ultrasound is recommended.  No focal abnormality is identified within the pancreas of the liver, spleen. The right  kidney has a lower pole cyst which measures 3.0 cm. The left kidney has a normal appearance.  Bowel: Colonic diverticula are present. Stomach, duodenum, and small bowel loops are normal in appearance.  Pelvis: The uterus is present. The urinary bladder is distended. No adnexal mass identified.  Retroperitoneum: There is dense atherosclerotic calcification of the aorta. No aneurysm.  Abdominal wall: Unremarkable.  Osseous structures: Marked degenerative changes and scoliosis.  IMPRESSION: 1. Marked distention of the gallbladder containing numerous stones. Appearance of septations within the gallbladder, raising the question of gallbladder wall edema. 2. Dilated intra and extrahepatic biliary ducts with choledocholithiasis. Distal common duct stricture cannot be entirely excluded but is not definitely identified. There is associated dilatation of the pancreatic duct. Further evaluation with abdominal ultrasound is recommended. 3. Cardiomegaly, coronary artery calcifications. 4. Right renal cyst. 5. Diverticulosis. 6. Scoliosis. 7. Atherosclerosis.   Electronically Signed   By: Rosalie Gums M.D.   On: 11/10/2013 01:43   Dg Chest Port 1 View  10/26/2013   CLINICAL DATA:  Chest pain  EXAM: PORTABLE CHEST - 1 VIEW  COMPARISON:  12/04/2012  FINDINGS: Normal heart size. Aortic arch remains tortuous. Left basilar atelectasis for scar is stable. Lungs otherwise clear. No pneumothorax or pleural effusion.  IMPRESSION: Chronic left basilar atelectasis for scar. No active cardiopulmonary disease.   Electronically Signed   By: Maryclare Bean M.D.   On: 11/01/2013 21:26    IMPRESSION/PLAN: 80 yo female admitted with  abd pain, found to have cholelithiasis and choledocholithiasis. Pt says she would not like surgery, but her daughter says she will speak to her mother about it.WBC normal, LFTs normal. Will review pt with Dr Leone Payor re possible ERCP.   Hvozdovic, Moise Boring 11/10/2013,  Pager [phone]  [facility] Attending  I  have also seen and assessed the patient and agree with the above note. Images viewed - has a very distended gallbladder that I suspect is source of her pain. CBD dilated and with CBD stones - obstruction here may be the source. Strangely the LFT's are NL.  I think ERCP with stenting might be helpful but not urgent - cannot do a sphincterotomy now on clopidogrel. Will reassess tomorrow - she has not wanted surgery - she was not able to have an informed consent discussion about an ERCP today. Continue Abx which will cover cholecystitis and possible cholangitis.  Iva Boop, MD, [facility] '

## 2013-11-10 NOTE — Plan of Care (Signed)
Problem: Phase I Progression Outcomes Goal: OOB as tolerated unless otherwise ordered Outcome: Not Progressing Patient is bedrest due to gallbladder pain in RUQ

## 2013-11-10 NOTE — Progress Notes (Signed)
Patient unable to swallow liquid potassium.  Changed to IV Potassium.  Dr. Rhona Leavens notified

## 2013-11-10 NOTE — Progress Notes (Signed)
Utilization Review Completed.Pamela Hayes T9/16/2015  

## 2013-11-10 NOTE — H&P (Signed)
[facility] Admission History and Physical       Pamela Hayes KKX:[id number] DOB: [birth year] DOA: 11/23/2013  Referring physician: EDP PCP: Georgianne Fick, MD  Specialists:   Chief Complaint: Severe ABD Pain  HPI: Pamela Hayes is a 80 y.o. female with a history of CAD, Hyperlipidemia, HTN,pvd, and previous DVT of the RLE who was brought to the ED due to sudden onset of constant severe 10/10 RUQ and Epigastric Area pain with nausea and vomiting since 6 pm.  Her daughters are at the bedside and assist with the history and report that she did not have any hematemesis or diarrhea or fevers or chills.     They report that she has had a poor appetite and weight loss for a long time.  She drinks ensure supplements occasionally.   In the ED, she was evaluated and found to have pain on examination with guarding and rebound.   A surgery consultation was placed.  She was sent for a CT scan and and [place] was also ordered.  The CT scan of the ABD returned revealing Multiple Gallstones with dilatation of the biliary Duct inferring Obstruction.   She was referred for medical admission  Code Status was discussed and goals of care, and per the family and the POA patient is a Do Not Resuscitate, and does not wish to have any surgery, however the family wishes to know the etiology of her pain, and to control her pain, and be given conservative options.    Review of Systems:  Constitutional: +Weight Loss, No Weight Gain, Night Sweats, Fevers, Chills, Dizziness, Fatigue,  +Generalized Weakness HEENT: No Headaches, Difficulty Swallowing,Tooth/Dental Problems,Sore Throat,  No Sneezing, Rhinitis, Ear Ache, Nasal Congestion, or Post Nasal Drip,  Cardio-vascular:  No Chest pain, Orthopnea, PND, Edema in Lower Extremities, Anasarca, Dizziness, Palpitations  Resp: No Dyspnea, No DOE, No Cough, No Hemoptysis, No Wheezing.    GI: No Heartburn, Indigestion, +Abdominal Pain, +Nausea, +Vomiting, Diarrhea,  Hematemesis, Hematochezia, Melena, Change in Bowel Habits,  +Loss of Appetite  GU: No Dysuria, Change in Color of Urine, No Urgency or Frequency, No Flank pain.  Musculoskeletal: No Joint Pain or Swelling, No Decreased Range of Motion, No Back Pain.  Neurologic: No Syncope, No Seizures, Muscle Weakness, Paresthesia, Vision Disturbance or Loss, No Diplopia, No Vertigo, No Difficulty Walking,  Skin: No Rash or Lesions. Psych: No Change in Mood or Affect, No Depression or Anxiety, No Memory loss, No Confusion, or Hallucinations   Past Medical History  Diagnosis Date  . Heart murmur   . Coronary artery disease   . Hypertension   . High cholesterol   . LVH (left ventricular hypertrophy) due to hypertensive disease   . DVT (deep venous thrombosis)     "right" (12/04/2012)  . Exertional shortness of breath   . TIA (transient ischemic attack)     "she's had several; all awhile back" (12/04/2012)  . Arthritis     "joints" (12/04/2012)  . PVD (peripheral vascular disease)   . Tachycardia 12/05/2012  . Arterial disease     Past Surgical History  Procedure Laterality Date  . Cataract extraction w/ intraocular lens  implant, bilateral Bilateral      Prior to Admission medications   Medication Sig Start Date End Date Taking? Authorizing Provider  acetaminophen (TYLENOL) 325 MG tablet Take 650 mg by mouth every 6 (six) hours as needed.   Yes Historical Provider, MD  aspirin EC 81 MG tablet Take 81 mg by mouth daily.  Yes Historical Provider, MD  atenolol (TENORMIN) 50 MG tablet Take 25 mg by mouth daily.   Yes Historical Provider, MD  brimonidine (ALPHAGAN) 0.2 % ophthalmic solution Place 1 drop into both eyes 2 (two) times daily.  11/10/12  Yes Historical Provider, MD  calcium carbonate (OS-CAL - DOSED IN MG OF ELEMENTAL CALCIUM) 1250 MG tablet Take 2 tablets by mouth daily with breakfast.   Yes Historical Provider, MD  clopidogrel (PLAVIX) 75 MG tablet Take 75 mg by mouth daily.   Yes  Historical Provider, MD  dorzolamide-timolol (COSOPT) 22.3-6.8 MG/ML ophthalmic solution Place 1 drop into both eyes 2 (two) times daily.   Yes Historical Provider, MD  PARoxetine (PAXIL) 40 MG tablet Take 40 mg by mouth every morning.   Yes Historical Provider, MD  polyethylene glycol (MIRALAX / GLYCOLAX) packet Take 17 g by mouth daily as needed. 12/05/12  Yes Nada Boozer, NP  pravastatin (PRAVACHOL) 40 MG tablet Take 40 mg by mouth daily.   Yes Historical Provider, MD  risedronate (ACTONEL) 35 MG tablet Take 35 mg by mouth every 7 (seven) days. On Saturday    -   with water on empty stomach, nothing by mouth or lie down for next 30 minutes.   Yes Historical Provider, MD  SANTYL ointment Apply 1 application topically every 3 (three) days. 05/07/13  Yes Historical Provider, MD  verapamil (VERELAN PM) 180 MG 24 hr capsule Take 180 mg by mouth daily.   Yes Historical Provider, MD      No Known Allergies   Social History:  reports that she has never smoked. She has never used smokeless tobacco. She reports that she does not drink alcohol or use illicit drugs.     Family History  Problem Relation Age of Onset  . Stroke Mother   . Heart disease Father        Physical Exam:  GEN:  Pleasant Cachectic Elderly  80 y.o. African American  female  examined  and in no acute distress; cooperative with exam Filed Vitals:   11/01/2013 2245 11/08/2013 2315 11/08/2013 2345 11/10/13 0000  BP: 191/96 168/90 187/81 170/88  Pulse: 95 94 97 94  Temp:      TempSrc:      Resp: 26 17 14 21   SpO2: 98% 98% 94% 97%   Blood pressure 170/88, pulse 94, temperature 98.4 F (36.9 C), temperature source Rectal, resp. rate 21, SpO2 97.00%. PSYCH: She is alert and oriented x4; does not appear anxious does not appear depressed; affect is normal HEENT: Normocephalic and Atraumatic, Mucous membranes pink; PERRLA; EOM intact; Fundi:  Benign;  No scleral icterus, Nares: Patent, Oropharynx: Clear, Edentulous,     Neck:   FROM, No Cervical Lymphadenopathy nor Thyromegaly or Carotid Bruit; No JVD; Breasts:: Not examined CHEST WALL: No tenderness CHEST: Normal respiration, clear to auscultation bilaterally HEART: Regular rate and rhythm; no murmurs rubs or gallops BACK: No kyphosis or scoliosis; No CVA tenderness ABDOMEN: Positive Bowel Sounds, Scaphoid,  Soft Non-Tender; No Masses, No Organomegaly. Rectal Exam: Not done EXTREMITIES: No Cyanosis, Clubbing, or Edema; No Ulcerations. Genitalia: not examined PULSES: 2+ and symmetric SKIN: Normal hydration no rash or ulceration CNS: Alert and oriented x 3,  Generalized Weakness, but no focal Deficits Vascular: pulses palpable throughout    Labs on Admission:  Basic Metabolic Panel:  Recent Labs Lab 10/31/2013 2103  NA 143  141  K 4.3  4.3  CL 104  102  CO2 25  24  GLUCOSE 131*  130*  BUN 17  16  CREATININE 0.74  0.76  CALCIUM 9.4  9.4   Liver Function Tests:  Recent Labs Lab 11/03/2013 2103  AST 18  ALT 8  ALKPHOS 58  BILITOT 0.2*  PROT 7.2  ALBUMIN 3.5    Recent Labs Lab 11/16/2013 2103  LIPASE 30   No results found for this basename: AMMONIA,  in the last 168 hours CBC:  Recent Labs Lab 11/04/2013 2103  WBC 7.2  HGB 12.4  HCT 36.4  MCV 92.2  PLT 241   Cardiac Enzymes: No results found for this basename: CKTOTAL, CKMB, CKMBINDEX, TROPONINI,  in the last 168 hours  BNP (last 3 results)  Recent Labs  11/03/2013 2103  PROBNP 850.9*   CBG: No results found for this basename: GLUCAP,  in the last 168 hours  Radiological Exams on Admission: Dg Abd 1 View  11/11/2013   CLINICAL DATA:  Abdominal pain.  EXAM: ABDOMEN - 1 VIEW  COMPARISON:  Abdominal radiograph performed 12/21/2008, and CT of the abdomen and pelvis from 12/11/2009  FINDINGS: The visualized bowel gas pattern is unremarkable. Scattered air filled loops of colon are seen; no abnormal dilatation of small bowel loops is seen to suggest small bowel obstruction. No  free intra-abdominal air is identified, though evaluation for free air is limited on a single supine view.  The visualized osseous structures are within normal limits; the sacroiliac joints are unremarkable in appearance. Mild right convex lumbar scoliosis is noted, with mild degenerative change at the lower lumbar spine. Scattered vascular calcifications are seen. The visualized lung bases are essentially clear.  IMPRESSION: 1. Unremarkable bowel gas pattern; no free intra-abdominal air seen. 2. Mild right convex lumbar scoliosis noted. 3. Scattered vascular calcifications seen.   Electronically Signed   By: Roanna Raider M.D.   On: 10/30/2013 22:23   Dg Chest Port 1 View  11/02/2013   CLINICAL DATA:  Chest pain  EXAM: PORTABLE CHEST - 1 VIEW  COMPARISON:  12/04/2012  FINDINGS: Normal heart size. Aortic arch remains tortuous. Left basilar atelectasis for scar is stable. Lungs otherwise clear. No pneumothorax or pleural effusion.  IMPRESSION: Chronic left basilar atelectasis for scar. No active cardiopulmonary disease.   Electronically Signed   By: Maryclare Bean M.D.   On: 10/30/2013 21:26     Assessment/Plan:   80 y.o. female with   Principal Problem:   1.    Abdominal pain, acute-    CT ABD performed and revealed Multiple Gallstones with Some Obstruction of the Biliary duct   [place] ABD ordered   General Surgery Consulted for Options   Pain Control with IVDilaudid  Active Problems:   2.   Nausea and vomiting   PRN IV anti-emetics     3.   HTN (hypertension)    IV Hydralazine PRN SBP > 160         4.   Hyperlipidemia   Continue Pravastatin Rx        5.   IVCD (intraventricular conduction defect)   Has pacemaker        6.   DVT Prophylaxis   Lovenox     Code Status:   FULL CODE Family Communication:   FAMILY MEMBERS at bedside Disposition Plan:    Observation  Time spent:  54 Minutes  Ron Parker [facility] Pager [phone]   If 7AM -7PM Please Contact the  Day Rounding Team MD for [facility]  If 7PM-7AM, Please Contact night-coverage  [email or web address]  Password [email or web address] 11/10/2013, 12:26 AM

## 2013-11-10 NOTE — Progress Notes (Signed)
[facility] PROGRESS NOTE  Pamela Hayes VOP:[id number] DOB: 1914-09-04 DOA: 11/05/2013 PCP: Georgianne Fick, MD  Assessment/Plan: 1. Abdominal pain, acute  -CT ABD revealing Multiple Gallstones with Obstruction of the Biliary duct  -[place] ABD ordered, pending -Pt seen by General Surgery but declines surgery -Instead, GI consulted per GI recs and in family agreement -Pain Control with IVDilaudid  2. Nausea and vomiting  -PRN IV anti-emetics  3. HTN (hypertension)  -IV Hydralazine PRN SBP > 160  4. Hyperlipidemia  -Continue Pravastatin Rx  5. IVCD (intraventricular conduction defect)  -Has pacemaker  6. DVT Prophylaxis  -Lovenox  Code Status: DNR Family Communication: Pt and family in room (indicate person spoken with, relationship, and if by phone, the number) Disposition Plan: Pending   Consultants:  Surgery  GI  Procedures:    Antibiotics:  none (indicate start date, and stop date if known)  HPI/Subjective: Continues to complain of RUQ pain and mild nausea  Objective: Filed Vitals:   11/10/13 0146 11/10/13 0541 11/10/13 0845 11/10/13 1145  BP: 135/80 122/63 126/75 132/67  Pulse: 103 109 105 105  Temp: 97.9 F (36.6 C) 98.1 F (36.7 C) 97.7 F (36.5 C)   TempSrc: Oral Oral Oral   Resp: 18 16 18 19   Height: 4\' 8"  (1.422 m)     Weight: 39.282 kg (86 lb 9.6 oz)     SpO2: 95% 97% 99% 100%    Intake/Output Summary (Last 24 hours) at 11/10/13 1456 Last data filed at 11/10/13 1100  Gross per 24 hour  Intake    240 ml  Output      2 ml  Net    238 ml   Filed Weights   11/10/13 0146  Weight: 39.282 kg (86 lb 9.6 oz)    Exam:   General:  Awake, in nad  Cardiovascular: regular, s1, s2  Respiratory: normal resp effort, no wheezing  Abdomen: soft,nondistended, mild tenderness over RUQ  Musculoskeletal: perfused, no clubbing   Data Reviewed: Basic Metabolic Panel:  Recent Labs Lab 11/20/2013 2103 11/10/13 0321  NA 143  141 140  K  4.3  4.3 3.3*  CL 104  102 102  CO2 25  24 21   GLUCOSE 131*  130* 125*  BUN 17  16 13   CREATININE 0.74  0.76 0.63  CALCIUM 9.4  9.4 8.6   Liver Function Tests:  Recent Labs Lab 11/16/2013 2103  AST 18  ALT 8  ALKPHOS 58  BILITOT 0.2*  PROT 7.2  ALBUMIN 3.5    Recent Labs Lab 11/16/2013 2103  LIPASE 30   No results found for this basename: AMMONIA,  in the last 168 hours CBC:  Recent Labs Lab 11/10/2013 2103 11/10/13 0321  WBC 7.2 9.4  HGB 12.4 11.9*  HCT 36.4 34.6*  MCV 92.2 91.5  PLT 241 228   Cardiac Enzymes: No results found for this basename: CKTOTAL, CKMB, CKMBINDEX, TROPONINI,  in the last 168 hours BNP (last 3 results)  Recent Labs  11/06/2013 2103  PROBNP 850.9*   CBG: No results found for this basename: GLUCAP,  in the last 168 hours  No results found for this or any previous visit (from the past 240 hour(s)).   Studies: Dg Abd 1 View  11/14/2013   CLINICAL DATA:  Abdominal pain.  EXAM: ABDOMEN - 1 VIEW  COMPARISON:  Abdominal radiograph performed 12/21/2008, and CT of the abdomen and pelvis from 12/11/2009  FINDINGS: The visualized bowel gas pattern is unremarkable. Scattered air filled loops  of colon are seen; no abnormal dilatation of small bowel loops is seen to suggest small bowel obstruction. No free intra-abdominal air is identified, though evaluation for free air is limited on a single supine view.  The visualized osseous structures are within normal limits; the sacroiliac joints are unremarkable in appearance. Mild right convex lumbar scoliosis is noted, with mild degenerative change at the lower lumbar spine. Scattered vascular calcifications are seen. The visualized lung bases are essentially clear.  IMPRESSION: 1. Unremarkable bowel gas pattern; no free intra-abdominal air seen. 2. Mild right convex lumbar scoliosis noted. 3. Scattered vascular calcifications seen.   Electronically Signed   By: Roanna Raider M.D.   On: 11/14/2013 22:23    Ct Abdomen Pelvis W Contrast  11/10/2013   CLINICAL DATA:  Nausea and vomiting.  Diffuse abdominal pain.  EXAM: CT ABDOMEN AND PELVIS WITH CONTRAST  TECHNIQUE: Multidetector CT imaging of the abdomen and pelvis was performed using the standard protocol following bolus administration of intravenous contrast.  CONTRAST:  41mL OMNIPAQUE IOHEXOL 300 MG/ML  SOLN  COMPARISON:  CT of the abdomen and pelvis on 12/11/2009  FINDINGS: Lower chest: The heart is enlarged. Dense coronary artery calcification is present.  Upper abdomen: There is marked dilatation of the intrahepatic and extrahepatic bile ducts. The gallbladder is distended and contains numerous stones. Hyperdense oval masses are also identified within the common bile duct measuring up to 1.5 cm, likely representing obstructing gallstones. The pancreatic duct is also dilated. Within the gallbladder, numerous septations are identified. This raises question of gallbladder wall edema. Further evaluation with ultrasound is recommended.  No focal abnormality is identified within the pancreas of the liver, spleen. The right kidney has a lower pole cyst which measures 3.0 cm. The left kidney has a normal appearance.  Bowel: Colonic diverticula are present. Stomach, duodenum, and small bowel loops are normal in appearance.  Pelvis: The uterus is present. The urinary bladder is distended. No adnexal mass identified.  Retroperitoneum: There is dense atherosclerotic calcification of the aorta. No aneurysm.  Abdominal wall: Unremarkable.  Osseous structures: Marked degenerative changes and scoliosis.  IMPRESSION: 1. Marked distention of the gallbladder containing numerous stones. Appearance of septations within the gallbladder, raising the question of gallbladder wall edema. 2. Dilated intra and extrahepatic biliary ducts with choledocholithiasis. Distal common duct stricture cannot be entirely excluded but is not definitely identified. There is associated dilatation of  the pancreatic duct. Further evaluation with abdominal ultrasound is recommended. 3. Cardiomegaly, coronary artery calcifications. 4. Right renal cyst. 5. Diverticulosis. 6. Scoliosis. 7. Atherosclerosis.   Electronically Signed   By: Rosalie Gums M.D.   On: 11/10/2013 01:43   Dg Chest Port 1 View  11/02/2013   CLINICAL DATA:  Chest pain  EXAM: PORTABLE CHEST - 1 VIEW  COMPARISON:  12/04/2012  FINDINGS: Normal heart size. Aortic arch remains tortuous. Left basilar atelectasis for scar is stable. Lungs otherwise clear. No pneumothorax or pleural effusion.  IMPRESSION: Chronic left basilar atelectasis for scar. No active cardiopulmonary disease.   Electronically Signed   By: Maryclare Bean M.D.   On: 11/08/2013 21:26    Scheduled Meds: . aspirin EC  81 mg Oral Daily  . atenolol  25 mg Oral Daily  . brimonidine  1 drop Both Eyes BID  . calcium carbonate  2 tablet Oral Q breakfast  . cefTRIAXone (ROCEPHIN)  IV  1 g Intravenous Q24H  . clopidogrel  75 mg Oral Daily  . dorzolamide-timolol  1 drop Both  Eyes BID  . enoxaparin (LOVENOX) injection  20 mg Subcutaneous Q24H  . polyethylene glycol  17 g Oral Daily  . potassium chloride  40 mEq Oral Once  . pravastatin  40 mg Oral q1800  . verapamil  180 mg Oral Daily   Continuous Infusions: . sodium chloride      Principal Problem:   Abdominal pain, acute Active Problems:   HTN (hypertension)   Hyperlipidemia   IVCD (intraventricular conduction defect)   Nausea and vomiting   Time spent:   Desteny Freeman K  [facility] Pager [phone]. If 7PM-7AM, please contact night-coverage at [email or web address], password [facility] 11/10/2013, 2:56 PM  LOS: 1 day

## 2013-11-10 NOTE — Progress Notes (Addendum)
INITIAL NUTRITION ASSESSMENT  DOCUMENTATION CODES Per approved criteria  -Non-severe (moderate) malnutrition in the context of chronic illness  Pt meets criteria for NON-SEVERE (MODERATE) MALNUTRITION in the context of CHRONIC ILLNESS as evidenced by 12% weight loss in less than 3 months and estimated energy intake <75% of estimated energy needs for > 1 month.  INTERVENTION: Provide Resource Breeze TID Diet advancement per MD Recommend discussion about adding an appetite stimulant to patients medications Change supplement to Ensure Complete TID when diet advances  NUTRITION DIAGNOSIS: Inadequate oral intake related to poor appetite as evidenced by 17% weight loss in the past year and 12% weight loss in less than 3 months.   Goal: Pt to meet >/= 90% of their estimated nutrition needs   Monitor:  PO intake, Diet advancement, weight trend, labs  Reason for Assessment: Malnutrition Screening Tool, score of 2  80 y.o. female  Admitting Dx: Abdominal pain, acute  ASSESSMENT: 80 y.o. female with a history of CAD, Hyperlipidemia, HTN,pvd, and previous DVT of the RLE who was brought to the ED due to sudden onset of constant severe 10/10 RUQ and Epigastric Area pain with nausea and vomiting since 6 pm.  Pt asleep at time of visit. Pt is on a clear liquid diet but, family reports pt hasn't been able to take PO's today due to planned procedure later. Per family pt drinks 2 Ensure shakes every morning and otherwise she only eats a few bites of food throughout the day. Estimated daily intake of 700-800 kcal.  Weight history shows pt has lost 12% of her body weight in the past 3 months. Per family N/V started yesterday evening. Per Nursing notes, pt had another episode of emesis around 1300 hr.  Family reports that pt is on an appetite stimulant but, it hasn't been working. Family reports medication is Promethazine HCL which is not known to be an appetite stimulant to this RD. Family is interested  in offering pt a different appetite stimulant.   Height: Ht Readings from Last 1 Encounters:  11/10/13 4\' 8"  (1.422 m)    Weight: Wt Readings from Last 1 Encounters:  11/10/13 86 lb 9.6 oz (39.282 kg)    Ideal Body Weight: 93 lbs  % Ideal Body Weight: 92%  Wt Readings from Last 10 Encounters:  11/10/13 86 lb 9.6 oz (39.282 kg)  08/24/13 98 lb (44.453 kg)  07/23/13 96 lb (43.545 kg)  06/29/13 98 lb (44.453 kg)  06/08/13 98 lb (44.453 kg)  01/05/13 112 lb 8 oz (51.03 kg)  12/05/12 104 lb 14.4 oz (47.582 kg)    Usual Body Weight: 105 lbs  % Usual Body Weight: 82%  BMI:  Body mass index is 19.43 kg/(m^2).  Estimated Nutritional Needs: Kcal: 1100-1200 kcal Protein: 45-55 grams Fluid: 1.1 L/day  Skin: open would on left foot  Diet Order: Clear Liquid  EDUCATION NEEDS: -No education needs identified at this time   Intake/Output Summary (Last 24 hours) at 11/10/13 1555 Last data filed at 11/10/13 1100  Gross per 24 hour  Intake    240 ml  Output      2 ml  Net    238 ml    Last BM: 9/15  Labs:   Recent Labs Lab 11/20/2013 2103 11/10/13 0321  NA 143  141 140  K 4.3  4.3 3.3*  CL 104  102 102  CO2 25  24 21   BUN 17  16 13   CREATININE 0.74  0.76 0.63  CALCIUM 9.4  9.4 8.6  GLUCOSE 131*  130* 125*    CBG (last 3)  No results found for this basename: GLUCAP,  in the last 72 hours  Scheduled Meds: . aspirin EC  81 mg Oral Daily  . atenolol  25 mg Oral Daily  . brimonidine  1 drop Both Eyes BID  . calcium carbonate  2 tablet Oral Q breakfast  . cefTRIAXone (ROCEPHIN)  IV  1 g Intravenous Q24H  . clopidogrel  75 mg Oral Daily  . dorzolamide-timolol  1 drop Both Eyes BID  . enoxaparin (LOVENOX) injection  20 mg Subcutaneous Q24H  . polyethylene glycol  17 g Oral Daily  . potassium chloride  40 mEq Oral Once  . pravastatin  40 mg Oral q1800  . verapamil  180 mg Oral Daily    Continuous Infusions: . sodium chloride      Past Medical  History  Diagnosis Date  . Heart murmur   . Coronary artery disease   . Hypertension   . High cholesterol   . LVH (left ventricular hypertrophy) due to hypertensive disease   . DVT (deep venous thrombosis)     "right" (12/04/2012)  . Exertional shortness of breath   . TIA (transient ischemic attack)     "she's had several; all awhile back" (12/04/2012)  . Arthritis     "joints" (12/04/2012)  . PVD (peripheral vascular disease)   . Tachycardia 12/05/2012  . Arterial disease     Past Surgical History  Procedure Laterality Date  . Cataract extraction w/ intraocular lens  implant, bilateral Bilateral     Ian Malkin RD, LDN Inpatient Clinical Dietitian Pager: [phone] After Hours Pager: [phone]

## 2013-11-10 NOTE — Progress Notes (Addendum)
Admitted pt from ed AAox4 accompanied by daugther.assisted to bed oriented to calllbell and room. Tele on.ivf infusing with zosyn to rt ac.noted left foot wound acc to daugther it is being seen in clinic. Pt incontinent of urine. Changed linens and gown. Continued to monitor

## 2013-11-10 NOTE — Progress Notes (Signed)
Pt with episodes of SVTs non sustained.HR goes 140's to 150's then drops down to low 100's.no c/o chest pains nor palpitations

## 2013-11-10 NOTE — Progress Notes (Signed)
[facility] Progress Note     Subjective: Pt says pain is a little better.  No N/V.  Touch makes the pain worse.    Objective: Vital signs in last 24 hours: Temp:  [97.5 F (36.4 C)-98.4 F (36.9 C)] 98.1 F (36.7 C) (09/16 0541) Pulse Rate:  [92-109] 109 (09/16 0541) Resp:  [14-26] 16 (09/16 0541) BP: (122-198)/(63-98) 122/63 mmHg (09/16 0541) SpO2:  [92 %-98 %] 97 % (09/16 0541) Weight:  [86 lb 9.6 oz (39.282 kg)] 86 lb 9.6 oz (39.282 kg) (09/16 0146) Last BM Date: 10/28/2013  Intake/Output from previous day:   Intake/Output this shift: Total I/O In: -  Out: 1 [Urine:1]  PE: Gen:  Alert, NAD, pleasant Abd: Soft, tender in RUQ and epigastrium, +BS, no HSM   Lab Results:   Recent Labs  11/10/2013 2103 11/10/13 0321  WBC 7.2 9.4  HGB 12.4 11.9*  HCT 36.4 34.6*  PLT 241 228   BMET  Recent Labs  11/21/2013 2103 11/10/13 0321  NA 143  141 140  K 4.3  4.3 3.3*  CL 104  102 102  CO2 25  24 21   GLUCOSE 131*  130* 125*  BUN 17  16 13   CREATININE 0.74  0.76 0.63  CALCIUM 9.4  9.4 8.6   PT/INR  Recent Labs  11/08/2013 2103  LABPROT 13.5  INR 1.03   CMP     Component Value Date/Time   NA 140 11/10/2013 0321   K 3.3* 11/10/2013 0321   CL 102 11/10/2013 0321   CO2 21 11/10/2013 0321   GLUCOSE 125* 11/10/2013 0321   BUN 13 11/10/2013 0321   CREATININE 0.63 11/10/2013 0321   CALCIUM 8.6 11/10/2013 0321   PROT 7.2 10/26/2013 2103   ALBUMIN 3.5 11/16/2013 2103   AST 18 11/14/2013 2103   ALT 8 11/11/2013 2103   ALKPHOS 58 11/16/2013 2103   BILITOT 0.2* 11/16/2013 2103   GFRNONAA 73* 11/10/2013 0321   GFRAA 84* 11/10/2013 0321   Lipase     Component Value Date/Time   LIPASE 30 11/19/2013 2103       Studies/Results: Dg Abd 1 View  11/07/2013   CLINICAL DATA:  Abdominal pain.  EXAM: ABDOMEN - 1 VIEW  COMPARISON:  Abdominal radiograph performed 12/21/2008, and CT of the abdomen and pelvis from 12/11/2009  FINDINGS: The visualized bowel gas  pattern is unremarkable. Scattered air filled loops of colon are seen; no abnormal dilatation of small bowel loops is seen to suggest small bowel obstruction. No free intra-abdominal air is identified, though evaluation for free air is limited on a single supine view.  The visualized osseous structures are within normal limits; the sacroiliac joints are unremarkable in appearance. Mild right convex lumbar scoliosis is noted, with mild degenerative change at the lower lumbar spine. Scattered vascular calcifications are seen. The visualized lung bases are essentially clear.  IMPRESSION: 1. Unremarkable bowel gas pattern; no free intra-abdominal air seen. 2. Mild right convex lumbar scoliosis noted. 3. Scattered vascular calcifications seen.   Electronically Signed   By: 12/23/2008 M.D.   On: 11/19/2013 22:23   Ct Abdomen Pelvis W Contrast  11/10/2013   CLINICAL DATA:  Nausea and vomiting.  Diffuse abdominal pain.  EXAM: CT ABDOMEN AND PELVIS WITH CONTRAST  TECHNIQUE: Multidetector CT imaging of the abdomen and pelvis was performed using the standard protocol following bolus administration of intravenous contrast.  CONTRAST:  41mL OMNIPAQUE IOHEXOL 300 MG/ML  SOLN  COMPARISON:  CT  of the abdomen and pelvis on 12/11/2009  FINDINGS: Lower chest: The heart is enlarged. Dense coronary artery calcification is present.  Upper abdomen: There is marked dilatation of the intrahepatic and extrahepatic bile ducts. The gallbladder is distended and contains numerous stones. Hyperdense oval masses are also identified within the common bile duct measuring up to 1.5 cm, likely representing obstructing gallstones. The pancreatic duct is also dilated. Within the gallbladder, numerous septations are identified. This raises question of gallbladder wall edema. Further evaluation with ultrasound is recommended.  No focal abnormality is identified within the pancreas of the liver, spleen. The right kidney has a lower pole cyst which  measures 3.0 cm. The left kidney has a normal appearance.  Bowel: Colonic diverticula are present. Stomach, duodenum, and small bowel loops are normal in appearance.  Pelvis: The uterus is present. The urinary bladder is distended. No adnexal mass identified.  Retroperitoneum: There is dense atherosclerotic calcification of the aorta. No aneurysm.  Abdominal wall: Unremarkable.  Osseous structures: Marked degenerative changes and scoliosis.  IMPRESSION: 1. Marked distention of the gallbladder containing numerous stones. Appearance of septations within the gallbladder, raising the question of gallbladder wall edema. 2. Dilated intra and extrahepatic biliary ducts with choledocholithiasis. Distal common duct stricture cannot be entirely excluded but is not definitely identified. There is associated dilatation of the pancreatic duct. Further evaluation with abdominal ultrasound is recommended. 3. Cardiomegaly, coronary artery calcifications. 4. Right renal cyst. 5. Diverticulosis. 6. Scoliosis. 7. Atherosclerosis.   Electronically Signed   By: Rosalie Gums M.D.   On: 11/10/2013 01:43   Dg Chest Port 1 View  December 09, 2013   CLINICAL DATA:  Chest pain  EXAM: PORTABLE CHEST - 1 VIEW  COMPARISON:  12/04/2012  FINDINGS: Normal heart size. Aortic arch remains tortuous. Left basilar atelectasis for scar is stable. Lungs otherwise clear. No pneumothorax or pleural effusion.  IMPRESSION: Chronic left basilar atelectasis for scar. No active cardiopulmonary disease.   Electronically Signed   By: Maryclare Bean M.D.   On: 2013-12-09 21:26    Anti-infectives: Anti-infectives   Start     Dose/Rate Route Frequency Ordered Stop   12-09-13 2230  piperacillin-tazobactam (ZOSYN) IVPB 3.375 g     3.375 g 12.5 mL/hr over 240 Minutes Intravenous  Once December 09, 2013 2221 11/10/13 0412   12/09/13 2230  vancomycin (VANCOCIN) IVPB 1000 mg/200 mL premix     1,000 mg 200 mL/hr over 60 Minutes Intravenous  Once 12-09-13 2221 11/10/13 0012        Assessment/Plan Cholelithiasis Choledocholithiasis SVT overnight Multiple medical problems including CAD, h/o TIA, PVD  Plan: 1.  IVF, pain control, IV antibiotics 2.  GI consult to discuss ERCP, stone removal/sphincterotomy 3.  Her age may prevent [place] from proceeding with surgery.  She is very hesitant as well.  Her daughter is at bedside.   4.  Pending ultrasound, will follow up after GI's recommendations    LOS: 1 day    DORT, Meeya Goldin 11/10/2013, 9:04 AM Pager: [phone]

## 2013-11-10 NOTE — Progress Notes (Signed)
Does not want surgery, not really age preventing her surgery but issues that may arise.  Does not really need [place] for our evaluation.

## 2013-11-10 NOTE — Progress Notes (Signed)
Patients had one occurrence of emesis about ten minutes after taking her mediations. The pills were dissolved and unidentifiable.

## 2013-11-10 NOTE — Progress Notes (Signed)
Patient having short bursts of SVT, but then comes back down to low 100's on telemetry.  Patient assymptomatic.   Dr Rhona Leavens notified.

## 2013-11-10 NOTE — Consult Note (Signed)
Reason for Consult:Gallstones and common bile duct stones Referring Physician: Jana Hakim  Pamela Hayes is an 80 y.o. female.  HPI: Patient presented to the emergency department with complaint of upper abdominal pain. She is a poor historian. According to her daughter who stays with her often, she usually drink Ensure but does not eat much. She drinks liquids well. She began complaining of upper abdominal pain. She was evaluated in the emergency department and admitted to the medical service. CT scan of the abdomen and pelvis demonstrates gallstones and common bile duct stones. We are asked to consult from a surgical standpoint.  Past Medical History  Diagnosis Date  . Heart murmur   . Coronary artery disease   . Hypertension   . High cholesterol   . LVH (left ventricular hypertrophy) due to hypertensive disease   . DVT (deep venous thrombosis)     "right" (12/04/2012)  . Exertional shortness of breath   . TIA (transient ischemic attack)     "she's had several; all awhile back" (12/04/2012)  . Arthritis     "joints" (12/04/2012)  . PVD (peripheral vascular disease)   . Tachycardia 12/05/2012  . Arterial disease     Past Surgical History  Procedure Laterality Date  . Cataract extraction w/ intraocular lens  implant, bilateral Bilateral     Family History  Problem Relation Age of Onset  . Stroke Mother   . Heart disease Father     Social History:  reports that she has never smoked. She has never used smokeless tobacco. She reports that she does not drink alcohol or use illicit drugs.  Allergies: No Known Allergies  Medications:  Scheduled: . aspirin EC  81 mg Oral Daily  . atenolol  25 mg Oral Daily  . brimonidine  1 drop Both Eyes BID  . calcium carbonate  2 tablet Oral Q breakfast  . clopidogrel  75 mg Oral Daily  . dorzolamide-timolol  1 drop Both Eyes BID  . enoxaparin (LOVENOX) injection  20 mg Subcutaneous Q24H  . polyethylene glycol  17 g Oral Daily  .  simvastatin  20 mg Oral q1800  . verapamil  180 mg Oral Daily   Continuous: . sodium chloride     IRW:[id number], acetaminophen, alum & mag hydroxide-simeth, HYDROmorphone (DILAUDID) injection, nitroGLYCERIN, ondansetron (ZOFRAN) IV, ondansetron, oxyCODONE  Results for orders placed during the hospital encounter of 11/21/2013 (from the past 48 hour(s))  CBC     Status: None   Collection Time    10/30/2013  9:03 PM      Result Value Ref Range   WBC 7.2  4.0 - 10.5 K/uL   RBC 3.95  3.87 - 5.11 MIL/uL   Hemoglobin 12.4  12.0 - 15.0 g/dL   HCT 36.4  36.0 - 46.0 %   MCV 92.2  78.0 - 100.0 fL   MCH 31.4  26.0 - 34.0 pg   MCHC 34.1  30.0 - 36.0 g/dL   RDW 13.2  11.5 - 15.5 %   Platelets 241  150 - 400 K/uL  BASIC METABOLIC PANEL     Status: Abnormal   Collection Time    11/24/2013  9:03 PM      Result Value Ref Range   Sodium 143  137 - 147 mEq/L   Potassium 4.3  3.7 - 5.3 mEq/L   Chloride 104  96 - 112 mEq/L   CO2 25  19 - 32 mEq/L   Glucose, Bld 131 (*) 70 - 99  mg/dL   BUN 17  6 - 23 mg/dL   Creatinine, Ser 4.84  0.50 - 1.10 mg/dL   Calcium 9.4  8.4 - 98.6 mg/dL   GFR calc non Af Amer 69 (*) >90 mL/min   GFR calc Af Amer 80 (*) >90 mL/min   Comment: (NOTE)     The eGFR has been calculated using the CKD EPI equation.     This calculation has not been validated in all clinical situations.     eGFR's persistently <90 mL/min signify possible Chronic Kidney     Disease.   Anion gap 14  5 - 15  PRO B NATRIURETIC PEPTIDE     Status: Abnormal   Collection Time    11/07/2013  9:03 PM      Result Value Ref Range   Pro B Natriuretic peptide (BNP) 850.9 (*) 0 - 450 pg/mL  PROTIME-INR     Status: None   Collection Time    11/24/2013  9:03 PM      Result Value Ref Range   Prothrombin Time 13.5  11.6 - 15.2 seconds   INR 1.03  0.00 - 1.49  COMPREHENSIVE METABOLIC PANEL     Status: Abnormal   Collection Time    10/28/2013  9:03 PM      Result Value Ref Range   Sodium 141  137 - 147 mEq/L    Potassium 4.3  3.7 - 5.3 mEq/L   Comment: HEMOLYSIS AT THIS LEVEL MAY AFFECT RESULT   Chloride 102  96 - 112 mEq/L   CO2 24  19 - 32 mEq/L   Glucose, Bld 130 (*) 70 - 99 mg/dL   BUN 16  6 - 23 mg/dL   Creatinine, Ser 5.16  0.50 - 1.10 mg/dL   Calcium 9.4  8.4 - 86.1 mg/dL   Total Protein 7.2  6.0 - 8.3 g/dL   Albumin 3.5  3.5 - 5.2 g/dL   AST 18  0 - 37 U/L   Comment: HEMOLYSIS AT THIS LEVEL MAY AFFECT RESULT   ALT 8  0 - 35 U/L   Comment: HEMOLYSIS AT THIS LEVEL MAY AFFECT RESULT   Alkaline Phosphatase 58  39 - 117 U/L   Total Bilirubin 0.2 (*) 0.3 - 1.2 mg/dL   GFR calc non Af Amer 68 (*) >90 mL/min   GFR calc Af Amer 79 (*) >90 mL/min   Comment: (NOTE)     The eGFR has been calculated using the CKD EPI equation.     This calculation has not been validated in all clinical situations.     eGFR's persistently <90 mL/min signify possible Chronic Kidney     Disease.   Anion gap 15  5 - 15  LIPASE, BLOOD     Status: None   Collection Time    11/14/2013  9:03 PM      Result Value Ref Range   Lipase 30  11 - 59 U/L  I-STAT TROPOININ, ED     Status: None   Collection Time    11/14/2013  9:08 PM      Result Value Ref Range   Troponin i, poc 0.01  0.00 - 0.08 ng/mL   Comment 3            Comment: Due to the release kinetics of cTnI,     a negative result within the first hours     of the onset of symptoms does not rule out     myocardial  infarction with certainty.     If myocardial infarction is still suspected,     repeat the test at appropriate intervals.  URINALYSIS, ROUTINE W REFLEX MICROSCOPIC     Status: Abnormal   Collection Time    11/22/2013  9:34 PM      Result Value Ref Range   Color, Urine YELLOW  YELLOW   APPearance CLOUDY (*) CLEAR   Specific Gravity, Urine 1.019  1.005 - 1.030   pH 7.0  5.0 - 8.0   Glucose, UA NEGATIVE  NEGATIVE mg/dL   Hgb urine dipstick NEGATIVE  NEGATIVE   Bilirubin Urine NEGATIVE  NEGATIVE   Ketones, ur NEGATIVE  NEGATIVE mg/dL   Protein,  ur 30 (*) NEGATIVE mg/dL   Urobilinogen, UA 1.0  0.0 - 1.0 mg/dL   Nitrite NEGATIVE  NEGATIVE   Leukocytes, UA SMALL (*) NEGATIVE  URINE MICROSCOPIC-ADD ON     Status: Abnormal   Collection Time    10/28/2013  9:34 PM      Result Value Ref Range   Squamous Epithelial / LPF RARE  RARE   WBC, UA 11-20  <3 WBC/hpf   RBC / HPF 3-6  <3 RBC/hpf   Bacteria, UA MANY (*) RARE   Crystals CA OXALATE CRYSTALS (*) NEGATIVE   Urine-Other MUCOUS PRESENT    POC OCCULT BLOOD, ED     Status: None   Collection Time    11/24/2013  9:45 PM      Result Value Ref Range   Fecal Occult Bld NEGATIVE  NEGATIVE  BASIC METABOLIC PANEL     Status: Abnormal   Collection Time    11/10/13  3:21 AM      Result Value Ref Range   Sodium 140  137 - 147 mEq/L   Potassium 3.3 (*) 3.7 - 5.3 mEq/L   Comment: DELTA CHECK NOTED     NO VISIBLE HEMOLYSIS   Chloride 102  96 - 112 mEq/L   CO2 21  19 - 32 mEq/L   Glucose, Bld 125 (*) 70 - 99 mg/dL   BUN 13  6 - 23 mg/dL   Creatinine, Ser 0.63  0.50 - 1.10 mg/dL   Calcium 8.6  8.4 - 10.5 mg/dL   GFR calc non Af Amer 73 (*) >90 mL/min   GFR calc Af Amer 84 (*) >90 mL/min   Comment: (NOTE)     The eGFR has been calculated using the CKD EPI equation.     This calculation has not been validated in all clinical situations.     eGFR's persistently <90 mL/min signify possible Chronic Kidney     Disease.   Anion gap 17 (*) 5 - 15  CBC     Status: Abnormal   Collection Time    11/10/13  3:21 AM      Result Value Ref Range   WBC 9.4  4.0 - 10.5 K/uL   RBC 3.78 (*) 3.87 - 5.11 MIL/uL   Hemoglobin 11.9 (*) 12.0 - 15.0 g/dL   HCT 34.6 (*) 36.0 - 46.0 %   MCV 91.5  78.0 - 100.0 fL   MCH 31.5  26.0 - 34.0 pg   MCHC 34.4  30.0 - 36.0 g/dL   RDW 13.2  11.5 - 15.5 %   Platelets 228  150 - 400 K/uL    Dg Abd 1 View  10/26/2013   CLINICAL DATA:  Abdominal pain.  EXAM: ABDOMEN - 1 VIEW  COMPARISON:  Abdominal radiograph performed 12/21/2008,  and CT of the abdomen and pelvis from  12/11/2009  FINDINGS: The visualized bowel gas pattern is unremarkable. Scattered air filled loops of colon are seen; no abnormal dilatation of small bowel loops is seen to suggest small bowel obstruction. No free intra-abdominal air is identified, though evaluation for free air is limited on a single supine view.  The visualized osseous structures are within normal limits; the sacroiliac joints are unremarkable in appearance. Mild right convex lumbar scoliosis is noted, with mild degenerative change at the lower lumbar spine. Scattered vascular calcifications are seen. The visualized lung bases are essentially clear.  IMPRESSION: 1. Unremarkable bowel gas pattern; no free intra-abdominal air seen. 2. Mild right convex lumbar scoliosis noted. 3. Scattered vascular calcifications seen.   Electronically Signed   By: Garald Balding M.D.   On: 10/26/2013 22:23   Ct Abdomen Pelvis W Contrast  11/10/2013   CLINICAL DATA:  Nausea and vomiting.  Diffuse abdominal pain.  EXAM: CT ABDOMEN AND PELVIS WITH CONTRAST  TECHNIQUE: Multidetector CT imaging of the abdomen and pelvis was performed using the standard protocol following bolus administration of intravenous contrast.  CONTRAST:  29mL OMNIPAQUE IOHEXOL 300 MG/ML  SOLN  COMPARISON:  CT of the abdomen and pelvis on 12/11/2009  FINDINGS: Lower chest: The heart is enlarged. Dense coronary artery calcification is present.  Upper abdomen: There is marked dilatation of the intrahepatic and extrahepatic bile ducts. The gallbladder is distended and contains numerous stones. Hyperdense oval masses are also identified within the common bile duct measuring up to 1.5 cm, likely representing obstructing gallstones. The pancreatic duct is also dilated. Within the gallbladder, numerous septations are identified. This raises question of gallbladder wall edema. Further evaluation with ultrasound is recommended.  No focal abnormality is identified within the pancreas of the liver, spleen.  The right kidney has a lower pole cyst which measures 3.0 cm. The left kidney has a normal appearance.  Bowel: Colonic diverticula are present. Stomach, duodenum, and small bowel loops are normal in appearance.  Pelvis: The uterus is present. The urinary bladder is distended. No adnexal mass identified.  Retroperitoneum: There is dense atherosclerotic calcification of the aorta. No aneurysm.  Abdominal wall: Unremarkable.  Osseous structures: Marked degenerative changes and scoliosis.  IMPRESSION: 1. Marked distention of the gallbladder containing numerous stones. Appearance of septations within the gallbladder, raising the question of gallbladder wall edema. 2. Dilated intra and extrahepatic biliary ducts with choledocholithiasis. Distal common duct stricture cannot be entirely excluded but is not definitely identified. There is associated dilatation of the pancreatic duct. Further evaluation with abdominal ultrasound is recommended. 3. Cardiomegaly, coronary artery calcifications. 4. Right renal cyst. 5. Diverticulosis. 6. Scoliosis. 7. Atherosclerosis.   Electronically Signed   By: Shon Hale M.D.   On: 11/10/2013 01:43   Dg Chest Port 1 View  11/14/2013   CLINICAL DATA:  Chest pain  EXAM: PORTABLE CHEST - 1 VIEW  COMPARISON:  12/04/2012  FINDINGS: Normal heart size. Aortic arch remains tortuous. Left basilar atelectasis for scar is stable. Lungs otherwise clear. No pneumothorax or pleural effusion.  IMPRESSION: Chronic left basilar atelectasis for scar. No active cardiopulmonary disease.   Electronically Signed   By: Maryclare Bean M.D.   On: 11/19/2013 21:26    Review of Systems  Unable to perform ROS: dementia   Blood pressure 135/80, pulse 103, temperature 97.9 F (36.6 C), temperature source Oral, resp. rate 18, height $RemoveBe'4\' 8"'[id number]$  (1.422 m), weight 86 lb 9.6 oz (39.282 kg), SpO2 95.00%.  Physical Exam  Constitutional: She appears well-developed. No distress.  HENT:  Head: Normocephalic.  Nose: Nose  normal.  Mouth/Throat: Oropharynx is clear and moist.  Eyes: EOM are normal.  Neck: Normal range of motion. Neck supple.  Cardiovascular: Normal rate and normal heart sounds.   Respiratory: Effort normal and breath sounds normal. No respiratory distress. She has no wheezes. She has no rales.  GI: Soft. She exhibits no distension. There is tenderness. There is no rebound and no guarding.  Minimal epigastric tenderness without guarding  Neurological: She is alert.  Answers questions and follows commands  Skin: Skin is warm.    Assessment/Plan: Cholelithiasis and choledocholithiasis. Recommend IV fluids and IV antibiotics. GI consultation. I discussed the possibility of surgery with her and she says she would not want to go through surgery. Her daughter wants to speak with her further about that. We will follow along and make plans accordingly.  Chayce Robbins E 11/10/2013, 5:11 AM

## 2013-11-10 NOTE — Progress Notes (Signed)
Applied pink sacral foam pad.

## 2013-11-11 DIAGNOSIS — K8063 Calculus of gallbladder and bile duct with acute cholecystitis with obstruction: Secondary | ICD-10-CM | POA: Diagnosis present

## 2013-11-11 DIAGNOSIS — I739 Peripheral vascular disease, unspecified: Secondary | ICD-10-CM | POA: Diagnosis present

## 2013-11-11 DIAGNOSIS — IMO0002 Reserved for concepts with insufficient information to code with codable children: Secondary | ICD-10-CM | POA: Diagnosis not present

## 2013-11-11 DIAGNOSIS — Z66 Do not resuscitate: Secondary | ICD-10-CM | POA: Diagnosis present

## 2013-11-11 DIAGNOSIS — Z515 Encounter for palliative care: Secondary | ICD-10-CM | POA: Diagnosis not present

## 2013-11-11 DIAGNOSIS — Z950 Presence of cardiac pacemaker: Secondary | ICD-10-CM | POA: Diagnosis not present

## 2013-11-11 DIAGNOSIS — K8309 Other cholangitis: Secondary | ICD-10-CM | POA: Diagnosis present

## 2013-11-11 DIAGNOSIS — G92 Toxic encephalopathy: Secondary | ICD-10-CM | POA: Diagnosis not present

## 2013-11-11 DIAGNOSIS — R112 Nausea with vomiting, unspecified: Secondary | ICD-10-CM

## 2013-11-11 DIAGNOSIS — Z8673 Personal history of transient ischemic attack (TIA), and cerebral infarction without residual deficits: Secondary | ICD-10-CM | POA: Diagnosis not present

## 2013-11-11 DIAGNOSIS — L84 Corns and callosities: Secondary | ICD-10-CM | POA: Diagnosis present

## 2013-11-11 DIAGNOSIS — R011 Cardiac murmur, unspecified: Secondary | ICD-10-CM | POA: Diagnosis present

## 2013-11-11 DIAGNOSIS — Z823 Family history of stroke: Secondary | ICD-10-CM | POA: Diagnosis not present

## 2013-11-11 DIAGNOSIS — Z7902 Long term (current) use of antithrombotics/antiplatelets: Secondary | ICD-10-CM | POA: Diagnosis not present

## 2013-11-11 DIAGNOSIS — E440 Moderate protein-calorie malnutrition: Secondary | ICD-10-CM | POA: Diagnosis present

## 2013-11-11 DIAGNOSIS — A419 Sepsis, unspecified organism: Secondary | ICD-10-CM | POA: Diagnosis not present

## 2013-11-11 DIAGNOSIS — R64 Cachexia: Secondary | ICD-10-CM | POA: Diagnosis present

## 2013-11-11 DIAGNOSIS — Z8249 Family history of ischemic heart disease and other diseases of the circulatory system: Secondary | ICD-10-CM | POA: Diagnosis not present

## 2013-11-11 DIAGNOSIS — Z9849 Cataract extraction status, unspecified eye: Secondary | ICD-10-CM | POA: Diagnosis not present

## 2013-11-11 DIAGNOSIS — R079 Chest pain, unspecified: Secondary | ICD-10-CM | POA: Diagnosis present

## 2013-11-11 DIAGNOSIS — R52 Pain, unspecified: Secondary | ICD-10-CM | POA: Diagnosis present

## 2013-11-11 DIAGNOSIS — Z86718 Personal history of other venous thrombosis and embolism: Secondary | ICD-10-CM | POA: Diagnosis not present

## 2013-11-11 DIAGNOSIS — I498 Other specified cardiac arrhythmias: Secondary | ICD-10-CM | POA: Diagnosis not present

## 2013-11-11 DIAGNOSIS — R404 Transient alteration of awareness: Secondary | ICD-10-CM | POA: Diagnosis not present

## 2013-11-11 DIAGNOSIS — M129 Arthropathy, unspecified: Secondary | ICD-10-CM | POA: Diagnosis present

## 2013-11-11 DIAGNOSIS — I119 Hypertensive heart disease without heart failure: Secondary | ICD-10-CM | POA: Diagnosis present

## 2013-11-11 DIAGNOSIS — Z7982 Long term (current) use of aspirin: Secondary | ICD-10-CM | POA: Diagnosis not present

## 2013-11-11 DIAGNOSIS — G929 Unspecified toxic encephalopathy: Secondary | ICD-10-CM | POA: Diagnosis not present

## 2013-11-11 DIAGNOSIS — I2510 Atherosclerotic heart disease of native coronary artery without angina pectoris: Secondary | ICD-10-CM | POA: Diagnosis present

## 2013-11-11 DIAGNOSIS — E785 Hyperlipidemia, unspecified: Secondary | ICD-10-CM | POA: Diagnosis present

## 2013-11-11 LAB — CBC
HCT: 34 % — ABNORMAL LOW (ref 36.0–46.0)
Hemoglobin: 11.6 g/dL — ABNORMAL LOW (ref 12.0–15.0)
MCH: 31.5 pg (ref 26.0–34.0)
MCHC: 34.1 g/dL (ref 30.0–36.0)
MCV: 92.4 fL (ref 78.0–100.0)
PLATELETS: 213 10*3/uL (ref 150–400)
RBC: 3.68 MIL/uL — ABNORMAL LOW (ref 3.87–5.11)
RDW: 13.5 % (ref 11.5–15.5)
WBC: 22.6 10*3/uL — AB (ref 4.0–10.5)

## 2013-11-11 LAB — COMPREHENSIVE METABOLIC PANEL
ALBUMIN: 3.1 g/dL — AB (ref 3.5–5.2)
ALT: 18 U/L (ref 0–35)
AST: 35 U/L (ref 0–37)
Alkaline Phosphatase: 69 U/L (ref 39–117)
Anion gap: 19 — ABNORMAL HIGH (ref 5–15)
BUN: 18 mg/dL (ref 6–23)
CALCIUM: 9 mg/dL (ref 8.4–10.5)
CO2: 19 meq/L (ref 19–32)
Chloride: 105 mEq/L (ref 96–112)
Creatinine, Ser: 0.8 mg/dL (ref 0.50–1.10)
GFR calc Af Amer: 69 mL/min — ABNORMAL LOW (ref >90)
GFR, EST NON AFRICAN AMERICAN: 60 mL/min — AB (ref >90)
Glucose, Bld: 112 mg/dL — ABNORMAL HIGH (ref 70–99)
Potassium: 4.2 mEq/L (ref 3.7–5.3)
SODIUM: 143 meq/L (ref 137–147)
Total Bilirubin: 0.6 mg/dL (ref 0.3–1.2)
Total Protein: 6.9 g/dL (ref 6.0–8.3)

## 2013-11-11 LAB — LIPASE, BLOOD: Lipase: 9 U/L — ABNORMAL LOW (ref 11–59)

## 2013-11-11 LAB — AMYLASE: AMYLASE: 17 U/L (ref 0–105)

## 2013-11-11 MED ORDER — HYDROMORPHONE HCL 1 MG/ML IJ SOLN
0.5000 mg | INTRAMUSCULAR | Status: DC | PRN
  Administered 2013-11-11 – 2013-11-15 (×11): 0.5 mg via INTRAVENOUS
  Filled 2013-11-11 (×13): qty 1

## 2013-11-11 MED ORDER — PIPERACILLIN-TAZOBACTAM IN DEX 2-0.25 GM/50ML IV SOLN
2.2500 g | Freq: Three times a day (TID) | INTRAVENOUS | Status: DC
  Administered 2013-11-11 – 2013-11-14 (×11): 2.25 g via INTRAVENOUS
  Filled 2013-11-11 (×14): qty 50

## 2013-11-11 MED ORDER — HYDROMORPHONE HCL 1 MG/ML IJ SOLN: 1.0000 mg | INTRAMUSCULAR | Status: DC | PRN

## 2013-11-11 MED ORDER — DILTIAZEM HCL 25 MG/5ML IV SOLN
10.0000 mg | Freq: Once | INTRAVENOUS | Status: AC
  Administered 2013-11-11: 10 mg via INTRAVENOUS
  Filled 2013-11-11: qty 5

## 2013-11-11 MED ORDER — HYDROMORPHONE HCL 1 MG/ML IJ SOLN
1.0000 mg | INTRAMUSCULAR | Status: AC
  Administered 2013-11-11: 1 mg via INTRAVENOUS
  Filled 2013-11-11: qty 1

## 2013-11-11 MED ORDER — LABETALOL HCL 5 MG/ML IV SOLN
5.0000 mg | Freq: Once | INTRAVENOUS | Status: AC
  Administered 2013-11-11: 5 mg via INTRAVENOUS
  Filled 2013-11-11: qty 4

## 2013-11-11 NOTE — Progress Notes (Signed)
MD ordered to hold 1000 PO meds for now.  Will carry out orders and continue to monitor.

## 2013-11-11 NOTE — Progress Notes (Signed)
[facility] Progress Note     Subjective: Pt says she feels good, but in obvious pain with palpation of her stomach.  No N/V.  3 Daughters at bedside.  Family and patient are adamantly refusing surgery.  Objective: Vital signs in last 24 hours: Temp:  [97.1 F (36.2 C)-100 F (37.8 C)] 99.6 F (37.6 C) (09/17 0612) Pulse Rate:  [83-105] 97 (09/17 0612) Resp:  [12-19] 18 (09/17 0612) BP: (95-132)/(53-73) 130/73 mmHg (09/17 0612) SpO2:  [97 %-100 %] 97 % (09/17 0612) Weight:  [87 lb (39.463 kg)] 87 lb (39.463 kg) (09/17 0646) Last BM Date: 10/28/2013  Intake/Output from previous day: 09/16 0701 - 09/17 0700 In: 240 [P.O.:240] Out: 1 [Urine:1] Intake/Output this shift:    PE: Gen:  Alert, NAD, pleasant Abd: Soft, tender in RUQ/epigastrium, ND, +BS, no HSM   Lab Results:   Recent Labs  11/10/13 0321 11/11/13 0331  WBC 9.4 22.6*  HGB 11.9* 11.6*  HCT 34.6* 34.0*  PLT 228 213   BMET  Recent Labs  11/10/13 0321 11/11/13 0331  NA 140 143  K 3.3* 4.2  CL 102 105  CO2 21 19  GLUCOSE 125* 112*  BUN 13 18  CREATININE 0.63 0.80  CALCIUM 8.6 9.0   PT/INR  Recent Labs  11/20/2013 2103  LABPROT 13.5  INR 1.03   CMP     Component Value Date/Time   NA 143 11/11/2013 0331   K 4.2 11/11/2013 0331   CL 105 11/11/2013 0331   CO2 19 11/11/2013 0331   GLUCOSE 112* 11/11/2013 0331   BUN 18 11/11/2013 0331   CREATININE 0.80 11/11/2013 0331   CALCIUM 9.0 11/11/2013 0331   PROT 6.9 11/11/2013 0331   ALBUMIN 3.1* 11/11/2013 0331   AST 35 11/11/2013 0331   ALT 18 11/11/2013 0331   ALKPHOS 69 11/11/2013 0331   BILITOT 0.6 11/11/2013 0331   GFRNONAA 60* 11/11/2013 0331   GFRAA 69* 11/11/2013 0331   Lipase     Component Value Date/Time   LIPASE 9* 11/11/2013 0331       Studies/Results: Dg Abd 1 View  10/30/2013   CLINICAL DATA:  Abdominal pain.  EXAM: ABDOMEN - 1 VIEW  COMPARISON:  Abdominal radiograph performed 12/21/2008, and CT of the abdomen and pelvis from  12/11/2009  FINDINGS: The visualized bowel gas pattern is unremarkable. Scattered air filled loops of colon are seen; no abnormal dilatation of small bowel loops is seen to suggest small bowel obstruction. No free intra-abdominal air is identified, though evaluation for free air is limited on a single supine view.  The visualized osseous structures are within normal limits; the sacroiliac joints are unremarkable in appearance. Mild right convex lumbar scoliosis is noted, with mild degenerative change at the lower lumbar spine. Scattered vascular calcifications are seen. The visualized lung bases are essentially clear.  IMPRESSION: 1. Unremarkable bowel gas pattern; no free intra-abdominal air seen. 2. Mild right convex lumbar scoliosis noted. 3. Scattered vascular calcifications seen.   Electronically Signed   By: Roanna Raider M.D.   On: 10/30/2013 22:23   Ct Abdomen Pelvis W Contrast  11/10/2013   CLINICAL DATA:  Nausea and vomiting.  Diffuse abdominal pain.  EXAM: CT ABDOMEN AND PELVIS WITH CONTRAST  TECHNIQUE: Multidetector CT imaging of the abdomen and pelvis was performed using the standard protocol following bolus administration of intravenous contrast.  CONTRAST:  59mL OMNIPAQUE IOHEXOL 300 MG/ML  SOLN  COMPARISON:  CT of the abdomen and pelvis on  12/11/2009  FINDINGS: Lower chest: The heart is enlarged. Dense coronary artery calcification is present.  Upper abdomen: There is marked dilatation of the intrahepatic and extrahepatic bile ducts. The gallbladder is distended and contains numerous stones. Hyperdense oval masses are also identified within the common bile duct measuring up to 1.5 cm, likely representing obstructing gallstones. The pancreatic duct is also dilated. Within the gallbladder, numerous septations are identified. This raises question of gallbladder wall edema. Further evaluation with ultrasound is recommended.  No focal abnormality is identified within the pancreas of the liver, spleen.  The right kidney has a lower pole cyst which measures 3.0 cm. The left kidney has a normal appearance.  Bowel: Colonic diverticula are present. Stomach, duodenum, and small bowel loops are normal in appearance.  Pelvis: The uterus is present. The urinary bladder is distended. No adnexal mass identified.  Retroperitoneum: There is dense atherosclerotic calcification of the aorta. No aneurysm.  Abdominal wall: Unremarkable.  Osseous structures: Marked degenerative changes and scoliosis.  IMPRESSION: 1. Marked distention of the gallbladder containing numerous stones. Appearance of septations within the gallbladder, raising the question of gallbladder wall edema. 2. Dilated intra and extrahepatic biliary ducts with choledocholithiasis. Distal common duct stricture cannot be entirely excluded but is not definitely identified. There is associated dilatation of the pancreatic duct. Further evaluation with abdominal ultrasound is recommended. 3. Cardiomegaly, coronary artery calcifications. 4. Right renal cyst. 5. Diverticulosis. 6. Scoliosis. 7. Atherosclerosis.   Electronically Signed   By: Rosalie Gums M.D.   On: 11/10/2013 01:43   Dg Chest Port 1 View  2013/11/24   CLINICAL DATA:  Chest pain  EXAM: PORTABLE CHEST - 1 VIEW  COMPARISON:  12/04/2012  FINDINGS: Normal heart size. Aortic arch remains tortuous. Left basilar atelectasis for scar is stable. Lungs otherwise clear. No pneumothorax or pleural effusion.  IMPRESSION: Chronic left basilar atelectasis for scar. No active cardiopulmonary disease.   Electronically Signed   By: Maryclare Bean M.D.   On: November 24, 2013 21:26    Anti-infectives: Anti-infectives   Start     Dose/Rate Route Frequency Ordered Stop   11/10/13 2200  Ampicillin-Sulbactam (UNASYN) 3 g in sodium chloride 0.9 % 100 mL IVPB     3 g 100 mL/hr over 60 Minutes Intravenous Every 8 hours 11/10/13 2137     11/10/13 1500  cefTRIAXone (ROCEPHIN) 1 g in dextrose 5 % 50 mL IVPB  Status:  Discontinued     Comments:  Pharmacy may adjust dosing strength / duration / interval for maximal efficacy   1 g 100 mL/hr over 30 Minutes Intravenous Every 24 hours 11/10/13 1333 11/10/13 2137   November 24, 2013 2230  piperacillin-tazobactam (ZOSYN) IVPB 3.375 g     3.375 g 12.5 mL/hr over 240 Minutes Intravenous  Once 2013-11-24 2221 11/10/13 0412   2013-11-24 2230  vancomycin (VANCOCIN) IVPB 1000 mg/200 mL premix     1,000 mg 200 mL/hr over 60 Minutes Intravenous  Once 2013/11/24 2221 11/10/13 0012       Assessment/Plan Cholelithiasis  Choledocholithiasis  Now with cholangitis - WBC 22 SVT overnight  Multiple medical problems including CAD, h/o TIA, PVD   Plan:  1. IVF, pain control, IV antibiotics  2. GI following 3. Surgery is off the table for the patient and her family.  They understand the risks associated with not operating and the pain she may be in if she has another stone pass/biliary colic.   4. Family (3 daughters) are now willing to proceed with ERCP  LOS: 2 days    Aris Georgia 11/11/2013, 9:28 AM Pager: [phone]

## 2013-11-11 NOTE — Progress Notes (Signed)
   Another note to follow but saw her this AM.  Changed Abx to Unasyn. She seems about the sam with R abd pain and tenderness - she has big increase in WBC  VSS  I think she at least has cholecystitis and may have component of cholangitis. She clearly does not want surgery.  I cannot sort out if she would be willing to have ERCP or percutaneous cholecystostomy from talking to her. Abx alone may fix this but too soon to tell and she is suffering.  Will f/u later today with my PA and I will round again.  Would keep NPO for now.  I think getting palliative care involved to facilitate goals of care is appropriate also.   Iva Boop, MD, [facility]  Gastroenterology [phone] (pager) 11/11/2013 8:00 AM

## 2013-11-11 NOTE — Progress Notes (Addendum)
[facility] Progress Note  Subjective:   Still with abd pain. Has not vomited but is nauseous. ON unasyn. WBC elevated. Has had low grade temp through the night.   Objective:  Vital signs in last 24 hours: Temp:  [97.1 F (36.2 C)-100 F (37.8 C)] 99.6 F (37.6 C) (09/17 0612) Pulse Rate:  [83-105] 97 (09/17 0612) Resp:  [12-19] 18 (09/17 0612) BP: (95-132)/(53-73) 130/73 mmHg (09/17 0612) SpO2:  [97 %-100 %] 97 % (09/17 0612) Weight:  [87 lb (39.463 kg)] 87 lb (39.463 kg) (09/17 0646) Last BM Date: 10/27/2013 General:   Alert,  Well-developed, AA female, moaning Heart:  Regular rate and rhythm; no murmurs Pulm lungs clear Abdomen:  Soft, tender RUQ and epigastric area, nondistended. Normal bowel sounds, without guarding, and without rebound.   Extremities:  Without edema. Neurologic:  Alert Psych:  Alert and cooperative.      Lab Results:  Recent Labs  11/14/2013 2103 11/10/13 0321 11/11/13 0331  WBC 7.2 9.4 22.6*  HGB 12.4 11.9* 11.6*  HCT 36.4 34.6* 34.0*  PLT 241 228 213   BMET  Recent Labs  11/03/2013 2103 11/10/13 0321 11/11/13 0331  NA 143  141 140 143  K 4.3  4.3 3.3* 4.2  CL 104  102 102 105  CO2 25  24 21 19   GLUCOSE 131*  130* 125* 112*  BUN 17  16 13 18   CREATININE 0.74  0.76 0.63 0.80  CALCIUM 9.4  9.4 8.6 9.0   LFT  Recent Labs  11/11/13 0331  PROT 6.9  ALBUMIN 3.1*  AST 35  ALT 18  ALKPHOS 69  BILITOT 0.6   PT/INR  Recent Labs  10/26/2013 2103  LABPROT 13.5  INR 1.03    Dg Abd 1 View  11/01/2013   CLINICAL DATA:  Abdominal pain.  EXAM: ABDOMEN - 1 VIEW  COMPARISON:  Abdominal radiograph performed 12/21/2008, and CT of the abdomen and pelvis from 12/11/2009  FINDINGS: The visualized bowel gas pattern is unremarkable. Scattered air filled loops of colon are seen; no abnormal dilatation of small bowel loops is seen to suggest small bowel obstruction. No free intra-abdominal air is identified, though  evaluation for free air is limited on a single supine view.  The visualized osseous structures are within normal limits; the sacroiliac joints are unremarkable in appearance. Mild right convex lumbar scoliosis is noted, with mild degenerative change at the lower lumbar spine. Scattered vascular calcifications are seen. The visualized lung bases are essentially clear.  IMPRESSION: 1. Unremarkable bowel gas pattern; no free intra-abdominal air seen. 2. Mild right convex lumbar scoliosis noted. 3. Scattered vascular calcifications seen.   Electronically Signed   By: 12/23/2008 M.D.   On: 11/24/2013 22:23   Ct Abdomen Pelvis W Contrast  11/10/2013   CLINICAL DATA:  Nausea and vomiting.  Diffuse abdominal pain.  EXAM: CT ABDOMEN AND PELVIS WITH CONTRAST  TECHNIQUE: Multidetector CT imaging of the abdomen and pelvis was performed using the standard protocol following bolus administration of intravenous contrast.  CONTRAST:  25mL OMNIPAQUE IOHEXOL 300 MG/ML  SOLN  COMPARISON:  CT of the abdomen and pelvis on 12/11/2009  FINDINGS: Lower chest: The heart is enlarged. Dense coronary artery calcification is present.  Upper abdomen: There is marked dilatation of the intrahepatic and extrahepatic bile ducts. The gallbladder is distended and contains numerous stones. Hyperdense oval masses are also identified within the common bile duct measuring up to 1.5 cm, likely representing obstructing  gallstones. The pancreatic duct is also dilated. Within the gallbladder, numerous septations are identified. This raises question of gallbladder wall edema. Further evaluation with ultrasound is recommended.  No focal abnormality is identified within the pancreas of the liver, spleen. The right kidney has a lower pole cyst which measures 3.0 cm. The left kidney has a normal appearance.  Bowel: Colonic diverticula are present. Stomach, duodenum, and small bowel loops are normal in appearance.  Pelvis: The uterus is present. The urinary  bladder is distended. No adnexal mass identified.  Retroperitoneum: There is dense atherosclerotic calcification of the aorta. No aneurysm.  Abdominal wall: Unremarkable.  Osseous structures: Marked degenerative changes and scoliosis.  IMPRESSION: 1. Marked distention of the gallbladder containing numerous stones. Appearance of septations within the gallbladder, raising the question of gallbladder wall edema. 2. Dilated intra and extrahepatic biliary ducts with choledocholithiasis. Distal common duct stricture cannot be entirely excluded but is not definitely identified. There is associated dilatation of the pancreatic duct. Further evaluation with abdominal ultrasound is recommended. 3. Cardiomegaly, coronary artery calcifications. 4. Right renal cyst. 5. Diverticulosis. 6. Scoliosis. 7. Atherosclerosis.   Electronically Signed   By: Rosalie Gums M.D.   On: 11/10/2013 01:43   Dg Chest Port 1 View  11/07/2013   CLINICAL DATA:  Chest pain  EXAM: PORTABLE CHEST - 1 VIEW  COMPARISON:  12/04/2012  FINDINGS: Normal heart size. Aortic arch remains tortuous. Left basilar atelectasis for scar is stable. Lungs otherwise clear. No pneumothorax or pleural effusion.  IMPRESSION: Chronic left basilar atelectasis for scar. No active cardiopulmonary disease.   Electronically Signed   By: Maryclare Bean M.D.   On: 10/26/2013 21:26    ASSESSMENT/PLAN:  80 yo female admitted with abd pain, found to have cholelithiasis and choledocholithiasis likely has cholangitis now.WBC elevated, LFTS remain normal. Daughters have concerns about anesthesia/ ERCP. Will continue abx at this time and review pt with Dr Leone Payor.      LOS: 2 days   Hvozdovic, Tollie Pizza PA-C 11/11/2013, Pager [phone]  [facility] Attending  I have also seen and assessed the patient and agree with the above note. She is worse but not unstable. I think she most likely has cholecystitis and agree with Dr. Dwain Sarna that percutaneous cholecystostomy is most sensible  one procedure to do (at least first). Could get PLTS if necessary to overcome clopidogrel. Difficult situation and is a high-risk patient no matter what but does have "fixable problems" I think and she did have a reasonable QOL prior to admit though was declining some. However at 58 she may not want any aggressive interventions.  Palliative care is coming to see her re: goals of care and I have discussed the situation with them.  Iva Boop, MD, [facility] [phone] (pager) 11/11/2013 12:19 PM

## 2013-11-11 NOTE — Consult Note (Addendum)
WOC wound consult note Reason for Consult: Consult requested for left foot; pt has a chronic wound which has greatly improved according to daughter at the bedside and is almost healed.  She is followed by the outpatient wound care center and has been prescribed a "brown topical ointment daily," they are not sure of the name of the product but I suspect it is Iodosorb. Wound type: Left plantar foot with dry callus; .2X.2cm.  No open wound, drainage, fluctuance or drainage. Dressing procedure/placement/frequency: Family states they will bring in the prescribed ointment from home and apply daily, then cover with a band aid.  Family is well-informed regarding wound care and pt can resume follow-up at the outpatient wound care center after discharge. Please re-consult if further assistance is needed.  Thank-you,  Cammie Mcgee MSN, RN, CWOCN, [place], CNS [phone]

## 2013-11-11 NOTE — Progress Notes (Signed)
Wbc up, would recommend zosyn or csn/flagyl for coverage due to resistance to unasyn.  If family wants intervention would recommend perc chole and possible ercp.  Would do perc chole first.

## 2013-11-11 NOTE — Progress Notes (Signed)
[facility] PROGRESS NOTE  Pamela Hayes ERD:[id number] DOB: 12/28/1914 DOA: 11/08/2013 PCP: Georgianne Fick, MD  Assessment/Plan: 1. Abdominal pain, acute secondary to choledocholithiasis and cholangitis  -CT ABD revealing Multiple Gallstones with Obstruction of the Biliary duct  -Pt seen by General Surgery but declines surgery -GI following -Pain Control with IVDilaudid  -Recs for possible perc cholecystectomy with ERCP per Surgery recs 2. Nausea and vomiting  -PRN IV anti-emetics  3. HTN (hypertension)  -IV Hydralazine PRN SBP > 160  4. Hyperlipidemia  -Continue Pravastatin Rx  5. IVCD (intraventricular conduction defect)  -Has pacemaker  6. DVT Prophylaxis  -Lovenox  Code Status: DNR Family Communication: Pt and family in room Disposition Plan: Pending   Consultants:  Surgery  GI  Procedures:    Antibiotics:  none  HPI/Subjective: Continues to complain of RUQ pain, poorly improved with morphine.  Objective: Filed Vitals:   11/10/13 2020 11/11/13 0121 11/11/13 0612 11/11/13 0646  BP: 118/62 95/53 130/73   Pulse: 92 83 97   Temp: 100 F (37.8 C) 99.7 F (37.6 C) 99.6 F (37.6 C)   TempSrc: Oral Oral Oral   Resp: 14 12 18    Height:      Weight:    39.463 kg (87 lb)  SpO2: 97% 98% 97%     Intake/Output Summary (Last 24 hours) at 11/11/13 1433 Last data filed at 11/10/13 1900  Gross per 24 hour  Intake      0 ml  Output      0 ml  Net      0 ml   Filed Weights   11/10/13 0146 11/11/13 0646  Weight: 39.282 kg (86 lb 9.6 oz) 39.463 kg (87 lb)    Exam:   General:  Awake, laying in bed grimacing in pain  Cardiovascular: regular, s1, s2  Respiratory: normal resp effort, no wheezing  Abdomen: soft,nondistended, RUQ abd tenderness  Musculoskeletal: perfused, no clubbing   Data Reviewed: Basic Metabolic Panel:  Recent Labs Lab 11/13/2013 2103 11/10/13 0321 11/11/13 0331  NA 143  141 140 143  K 4.3  4.3 3.3* 4.2  CL 104   102 102 105  CO2 25  24 21 19   GLUCOSE 131*  130* 125* 112*  BUN 17  16 13 18   CREATININE 0.74  0.76 0.63 0.80  CALCIUM 9.4  9.4 8.6 9.0   Liver Function Tests:  Recent Labs Lab 11/03/2013 2103 11/11/13 0331  AST 18 35  ALT 8 18  ALKPHOS 58 69  BILITOT 0.2* 0.6  PROT 7.2 6.9  ALBUMIN 3.5 3.1*    Recent Labs Lab 11/18/2013 2103 11/11/13 0331  LIPASE 30 9*  AMYLASE  --  17   No results found for this basename: AMMONIA,  in the last 168 hours CBC:  Recent Labs Lab 11/14/2013 2103 11/10/13 0321 11/11/13 0331  WBC 7.2 9.4 22.6*  HGB 12.4 11.9* 11.6*  HCT 36.4 34.6* 34.0*  MCV 92.2 91.5 92.4  PLT 241 228 213   Cardiac Enzymes: No results found for this basename: CKTOTAL, CKMB, CKMBINDEX, TROPONINI,  in the last 168 hours BNP (last 3 results)  Recent Labs  11/21/2013 2103  PROBNP 850.9*   CBG: No results found for this basename: GLUCAP,  in the last 168 hours  Recent Results (from the past 240 hour(s))  URINE CULTURE     Status: None   Collection Time    10/27/2013  9:34 PM      Result Value Ref Range Status  Specimen Description URINE, CATHETERIZED   Final   Special Requests ADDED [id number]   Final   Culture  Setup Time     Final   Value: 11/10/2013 03:19     Performed at [facility]   Colony Count     Final   Value: >=100,000 COLONIES/ML     Performed at [facility]   Culture     Final   Value: GRAM NEGATIVE RODS     Performed at [facility]   Report Status PENDING   Incomplete     Studies: Dg Abd 1 View  11/18/2013   CLINICAL DATA:  Abdominal pain.  EXAM: ABDOMEN - 1 VIEW  COMPARISON:  Abdominal radiograph performed 12/21/2008, and CT of the abdomen and pelvis from 12/11/2009  FINDINGS: The visualized bowel gas pattern is unremarkable. Scattered air filled loops of colon are seen; no abnormal dilatation of small bowel loops is seen to suggest small bowel obstruction. No free intra-abdominal air is identified, though  evaluation for free air is limited on a single supine view.  The visualized osseous structures are within normal limits; the sacroiliac joints are unremarkable in appearance. Mild right convex lumbar scoliosis is noted, with mild degenerative change at the lower lumbar spine. Scattered vascular calcifications are seen. The visualized lung bases are essentially clear.  IMPRESSION: 1. Unremarkable bowel gas pattern; no free intra-abdominal air seen. 2. Mild right convex lumbar scoliosis noted. 3. Scattered vascular calcifications seen.   Electronically Signed   By: Roanna Raider M.D.   On: 11/13/2013 22:23   Ct Abdomen Pelvis W Contrast  11/10/2013   CLINICAL DATA:  Nausea and vomiting.  Diffuse abdominal pain.  EXAM: CT ABDOMEN AND PELVIS WITH CONTRAST  TECHNIQUE: Multidetector CT imaging of the abdomen and pelvis was performed using the standard protocol following bolus administration of intravenous contrast.  CONTRAST:  57mL OMNIPAQUE IOHEXOL 300 MG/ML  SOLN  COMPARISON:  CT of the abdomen and pelvis on 12/11/2009  FINDINGS: Lower chest: The heart is enlarged. Dense coronary artery calcification is present.  Upper abdomen: There is marked dilatation of the intrahepatic and extrahepatic bile ducts. The gallbladder is distended and contains numerous stones. Hyperdense oval masses are also identified within the common bile duct measuring up to 1.5 cm, likely representing obstructing gallstones. The pancreatic duct is also dilated. Within the gallbladder, numerous septations are identified. This raises question of gallbladder wall edema. Further evaluation with ultrasound is recommended.  No focal abnormality is identified within the pancreas of the liver, spleen. The right kidney has a lower pole cyst which measures 3.0 cm. The left kidney has a normal appearance.  Bowel: Colonic diverticula are present. Stomach, duodenum, and small bowel loops are normal in appearance.  Pelvis: The uterus is present. The urinary  bladder is distended. No adnexal mass identified.  Retroperitoneum: There is dense atherosclerotic calcification of the aorta. No aneurysm.  Abdominal wall: Unremarkable.  Osseous structures: Marked degenerative changes and scoliosis.  IMPRESSION: 1. Marked distention of the gallbladder containing numerous stones. Appearance of septations within the gallbladder, raising the question of gallbladder wall edema. 2. Dilated intra and extrahepatic biliary ducts with choledocholithiasis. Distal common duct stricture cannot be entirely excluded but is not definitely identified. There is associated dilatation of the pancreatic duct. Further evaluation with abdominal ultrasound is recommended. 3. Cardiomegaly, coronary artery calcifications. 4. Right renal cyst. 5. Diverticulosis. 6. Scoliosis. 7. Atherosclerosis.   Electronically Signed   By: Sandria Senter.D.  On: 11/10/2013 01:43   Dg Chest Port 1 View  11/03/2013   CLINICAL DATA:  Chest pain  EXAM: PORTABLE CHEST - 1 VIEW  COMPARISON:  12/04/2012  FINDINGS: Normal heart size. Aortic arch remains tortuous. Left basilar atelectasis for scar is stable. Lungs otherwise clear. No pneumothorax or pleural effusion.  IMPRESSION: Chronic left basilar atelectasis for scar. No active cardiopulmonary disease.   Electronically Signed   By: Maryclare Bean M.D.   On: 11/14/2013 21:26    Scheduled Meds: . aspirin EC  81 mg Oral Daily  . atenolol  25 mg Oral Daily  . brimonidine  1 drop Both Eyes BID  . calcium carbonate  2 tablet Oral Q breakfast  . dorzolamide-timolol  1 drop Both Eyes BID  . enoxaparin (LOVENOX) injection  20 mg Subcutaneous Q24H  . feeding supplement (RESOURCE BREEZE)  1 Container Oral TID WC  . piperacillin-tazobactam (ZOSYN)  IV  2.25 g Intravenous 3 times per day  . polyethylene glycol  17 g Oral Daily  . pravastatin  40 mg Oral q1800  . verapamil  180 mg Oral Daily   Continuous Infusions: . sodium chloride      Principal Problem:   Abdominal  pain, acute Active Problems:   HTN (hypertension)   Hyperlipidemia   IVCD (intraventricular conduction defect)   Chest pain, vague pain, more holding her chest, negative MI   Nausea and vomiting   Malnutrition of moderate degree   Dilated gallbladder   Cholelithiasis with choledocholithiasis  Time spent:  CHIU, STEPHEN K  [facility] Pager [phone]. If 7PM-7AM, please contact night-coverage at [email or web address], password [facility] 11/11/2013, 2:33 PM  LOS: 2 days

## 2013-11-11 NOTE — Progress Notes (Addendum)
Pharmacy Consult - Zosyn  Cholecystitis -- Switch from Unasyn 80 year old female weighing 90 lb CrCl close to 20 ml/hr  Plan: Zosyn 2.25 grams iv Q 8 hours Pharmacy will sign off -- re-consult if needed  Thank you. Okey Regal, PharmD

## 2013-11-11 NOTE — Progress Notes (Signed)
Patient heart rate spiking into 170's. Patient denies any discomfort.  Vital signs otherwise WNL.  MD on call notified, received order for one-time dose of Labetolol.  Heart rate remained unchanged after receiving Labetolol, MD notified again, received one time order for Cardizem.  Will continue to monitor. Blood pressure 128/72, pulse 121, temperature 97.7 F (36.5 C), temperature source Oral, resp. rate 18, height 4\' 8"  (1.422 m), weight 39.463 kg (87 lb), SpO2 98.00%. 

## 2013-11-12 ENCOUNTER — Encounter (HOSPITAL_COMMUNITY): Admission: EM | Disposition: E | Payer: Self-pay | Source: Home / Self Care | Attending: Internal Medicine

## 2013-11-12 ENCOUNTER — Inpatient Hospital Stay (HOSPITAL_COMMUNITY): Payer: Medicare Other

## 2013-11-12 ENCOUNTER — Encounter (HOSPITAL_COMMUNITY): Payer: Self-pay | Admitting: Anesthesiology

## 2013-11-12 ENCOUNTER — Inpatient Hospital Stay (HOSPITAL_COMMUNITY): Payer: Medicare Other | Admitting: Anesthesiology

## 2013-11-12 ENCOUNTER — Encounter (HOSPITAL_COMMUNITY): Payer: Medicare Other | Admitting: Anesthesiology

## 2013-11-12 DIAGNOSIS — Z66 Do not resuscitate: Secondary | ICD-10-CM

## 2013-11-12 DIAGNOSIS — Z515 Encounter for palliative care: Secondary | ICD-10-CM

## 2013-11-12 DIAGNOSIS — R109 Unspecified abdominal pain: Secondary | ICD-10-CM

## 2013-11-12 DIAGNOSIS — R52 Pain, unspecified: Secondary | ICD-10-CM

## 2013-11-12 HISTORY — PX: ERCP: SHX5425

## 2013-11-12 LAB — URINE CULTURE: Colony Count: >=100000

## 2013-11-12 SURGERY — ERCP, WITH INTERVENTION IF INDICATED
Anesthesia: General

## 2013-11-12 MED ORDER — PROPOFOL 10 MG/ML IV BOLUS
INTRAVENOUS | Status: DC | PRN
  Administered 2013-11-12 (×2): 10 mg via INTRAVENOUS
  Administered 2013-11-12: 40 mg via INTRAVENOUS
  Administered 2013-11-12 (×4): 10 mg via INTRAVENOUS

## 2013-11-12 MED ORDER — LACTATED RINGERS IV SOLN
INTRAVENOUS | Status: DC
  Administered 2013-11-12: 100 mL via INTRAVENOUS

## 2013-11-12 MED ORDER — SUCCINYLCHOLINE CHLORIDE 20 MG/ML IJ SOLN
INTRAMUSCULAR | Status: DC | PRN
  Administered 2013-11-12: 40 mg via INTRAVENOUS

## 2013-11-12 MED ORDER — METOPROLOL TARTRATE 1 MG/ML IV SOLN
5.0000 mg | Freq: Four times a day (QID) | INTRAVENOUS | Status: DC
  Administered 2013-11-12 – 2013-11-15 (×10): 5 mg via INTRAVENOUS
  Filled 2013-11-12 (×17): qty 5

## 2013-11-12 MED ORDER — FENTANYL 12 MCG/HR TD PT72
12.5000 ug | MEDICATED_PATCH | TRANSDERMAL | Status: DC
  Administered 2013-11-12: 12.5 ug via TRANSDERMAL
  Filled 2013-11-12: qty 1

## 2013-11-12 MED ORDER — LIDOCAINE HCL 4 % MT SOLN
OROMUCOSAL | Status: DC | PRN
  Administered 2013-11-12: 4 mL via TOPICAL

## 2013-11-12 MED ORDER — PHENYLEPHRINE HCL 10 MG/ML IJ SOLN
INTRAMUSCULAR | Status: DC | PRN
  Administered 2013-11-12: 40 ug via INTRAVENOUS
  Administered 2013-11-12 (×2): 80 ug via INTRAVENOUS

## 2013-11-12 MED ORDER — SODIUM CHLORIDE 0.9 % IV SOLN: INTRAVENOUS | Status: DC

## 2013-11-12 MED ORDER — HYDRALAZINE HCL 20 MG/ML IJ SOLN
10.0000 mg | INTRAMUSCULAR | Status: DC | PRN
  Administered 2013-11-13 – 2013-11-14 (×2): 10 mg via INTRAVENOUS
  Filled 2013-11-12 (×2): qty 1

## 2013-11-12 MED ORDER — INDOMETHACIN 50 MG RE SUPP
100.0000 mg | Freq: Once | RECTAL | Status: AC
  Administered 2013-11-12: 100 mg via RECTAL
  Filled 2013-11-12 (×2): qty 2

## 2013-11-12 MED ORDER — IBUPROFEN 400 MG PO TABS
400.0000 mg | ORAL_TABLET | ORAL | Status: DC | PRN
  Administered 2013-11-12: 400 mg via ORAL
  Filled 2013-11-12: qty 1

## 2013-11-12 MED ORDER — FENTANYL CITRATE 0.05 MG/ML IJ SOLN
INTRAMUSCULAR | Status: DC | PRN
Start: 2013-11-12 — End: 2013-11-12
  Administered 2013-11-12: 25 ug via INTRAVENOUS

## 2013-11-12 MED ORDER — SODIUM CHLORIDE 0.9 % IV SOLN
INTRAVENOUS | Status: DC | PRN
  Administered 2013-11-12: 15:00:00

## 2013-11-12 MED ORDER — LIDOCAINE HCL (CARDIAC) 20 MG/ML IV SOLN
INTRAVENOUS | Status: DC | PRN
  Administered 2013-11-12: 50 mg via INTRAVENOUS

## 2013-11-12 MED ORDER — LACTATED RINGERS IV SOLN
INTRAVENOUS | Status: DC | PRN
  Administered 2013-11-12: 13:00:00 via INTRAVENOUS

## 2013-11-12 MED ORDER — GLUCAGON HCL RDNA (DIAGNOSTIC) 1 MG IJ SOLR
INTRAMUSCULAR | Status: AC
  Filled 2013-11-12: qty 1

## 2013-11-12 NOTE — Progress Notes (Signed)
Pt HR went up to 190's nonsustained, pt not c/o discomfort. VS wnl.  MD paged.

## 2013-11-12 NOTE — Progress Notes (Signed)
[facility] PROGRESS NOTE  Pamela Hayes SLH:[id number] DOB: [birth year] DOA: 10/31/2013 PCP: Georgianne Fick, MD  Assessment/Plan: 1. Abdominal pain, acute secondary to choledocholithiasis and cholecystitis with sepsis  -CT ABD revealing Multiple Gallstones with Obstruction of the Biliary duct  -Pt initially seen by General Surgery but pt had declined surgery -GI also following -Pain Control with IVDilaudid, Low dose fentanyl patch added -Pt is now s/p ERCP with massively dilated cbd and hepatic ducts with at least 2 stones seen. Stent placed - Leukocytosis to 22.6k noted as of 9/18 with fevers - Pt is continued on Unasyn 2. Nausea and vomiting  -PRN IV anti-emetics  3. HTN (hypertension)  -IV Hydralazine PRN SBP > 160  -Difficult for pt to tolerate PO meds given above 4. Hyperlipidemia  -Continue Pravastatin Rx  5. IVCD (intraventricular conduction defect)  -Has pacemaker  6. DVT Prophylaxis  -SCD's  Code Status: DNR Family Communication: Pt and family in room Disposition Plan: Pending   Consultants:  Surgery  GI  Procedures:    Antibiotics:  none  HPI/Subjective: Continues with pain, better improved this AM  Objective: Filed Vitals:   11/13/2013 1500 11/19/2013 1510 11/16/2013 1538 11/11/2013 1702  BP: 177/85 174/77 161/84 162/102  Pulse: 56 102 99 105  Temp:   97.9 F (36.6 C)   TempSrc:   Axillary   Resp: 16 16 16    Height:      Weight:      SpO2: 95% 94% 95%     Intake/Output Summary (Last 24 hours) at 11/14/2013 1711 Last data filed at 11/16/2013 1430  Gross per 24 hour  Intake    500 ml  Output      1 ml  Net    499 ml   Filed Weights   11/10/13 0146 11/11/13 0646 11/19/2013 0404  Weight: 39.282 kg (86 lb 9.6 oz) 39.463 kg (87 lb) 40.461 kg (89 lb 3.2 oz)    Exam:   General:  Awake, laying in bed grimacing in pain  Cardiovascular: regular, s1, s2  Respiratory: normal resp effort, no wheezing  Abdomen: soft,nondistended, RUQ abd  tenderness  Musculoskeletal: perfused, no clubbing   Data Reviewed: Basic Metabolic Panel:  Recent Labs Lab 11/07/2013 2103 11/10/13 0321 11/11/13 0331  NA 143  141 140 143  K 4.3  4.3 3.3* 4.2  CL 104  102 102 105  CO2 25  24 21 19   GLUCOSE 131*  130* 125* 112*  BUN 17  16 13 18   CREATININE 0.74  0.76 0.63 0.80  CALCIUM 9.4  9.4 8.6 9.0   Liver Function Tests:  Recent Labs Lab 11/07/2013 2103 11/11/13 0331  AST 18 35  ALT 8 18  ALKPHOS 58 69  BILITOT 0.2* 0.6  PROT 7.2 6.9  ALBUMIN 3.5 3.1*    Recent Labs Lab 11/07/2013 2103 11/11/13 0331  LIPASE 30 9*  AMYLASE  --  17   No results found for this basename: AMMONIA,  in the last 168 hours CBC:  Recent Labs Lab 11/13/2013 2103 11/10/13 0321 11/11/13 0331  WBC 7.2 9.4 22.6*  HGB 12.4 11.9* 11.6*  HCT 36.4 34.6* 34.0*  MCV 92.2 91.5 92.4  PLT 241 228 213   Cardiac Enzymes: No results found for this basename: CKTOTAL, CKMB, CKMBINDEX, TROPONINI,  in the last 168 hours BNP (last 3 results)  Recent Labs  10/31/2013 2103  PROBNP 850.9*   CBG: No results found for this basename: GLUCAP,  in the last 168 hours  Recent  Results (from the past 240 hour(s))  URINE CULTURE     Status: None   Collection Time    10/28/2013  9:34 PM      Result Value Ref Range Status   Specimen Description URINE, CATHETERIZED   Final   Special Requests ADDED [id number]   Final   Culture  Setup Time     Final   Value: 11/10/2013 03:19     Performed at [facility]   Colony Count     Final   Value: >=100,000 COLONIES/ML     Performed at [facility]   Culture     Final   Value: KLEBSIELLA PNEUMONIAE     Performed at [facility]   Report Status 11/01/2013 FINAL   Final   Organism ID, Bacteria KLEBSIELLA PNEUMONIAE   Final     Studies: Dg Ercp  11/16/2013   CLINICAL DATA:  Choledocholithiasis  EXAM: ERCP with sphincterotomy and placement of a plastic biliary stent  TECHNIQUE: Multiple spot  images obtained with the fluoroscopic device and submitted for interpretation post-procedure.  COMPARISON:  CT abdomen/ pelvis 11/10/2013  FINDINGS: Multiple intraoperative spot images were obtained at the time of the ERCP. The images depict a flexible endoscope in the descending duodenum with wire cannulation of the common bile duct. Cholangiogram demonstrates intra and extrahepatic biliary ductal dilatation. There is at least 1 ovoid filling defect consistent with choledocholithiasis. The final image demonstrates placement of a plastic biliary stent.  IMPRESSION: 1. Choledocholithiasis with intra and extrahepatic biliary ductal dilatation. 2. Placement of a plastic biliary stent. These images were submitted for radiologic interpretation only. Please see the procedural report for the amount of contrast and the fluoroscopy time utilized.   Electronically Signed   By: Malachy Moan M.D.   On: 11/04/2013 14:48    Scheduled Meds: . aspirin EC  81 mg Oral Daily  . atenolol  25 mg Oral Daily  . brimonidine  1 drop Both Eyes BID  . calcium carbonate  2 tablet Oral Q breakfast  . dorzolamide-timolol  1 drop Both Eyes BID  . enoxaparin (LOVENOX) injection  20 mg Subcutaneous Q24H  . feeding supplement (RESOURCE BREEZE)  1 Container Oral TID WC  . fentaNYL  12.5 mcg Transdermal Q72H  . piperacillin-tazobactam (ZOSYN)  IV  2.25 g Intravenous 3 times per day  . polyethylene glycol  17 g Oral Daily  . pravastatin  40 mg Oral q1800  . verapamil  180 mg Oral Daily   Continuous Infusions: . sodium chloride 100 mL/hr at 11/20/2013 1118    Principal Problem:   Abdominal pain, acute Active Problems:   HTN (hypertension)   Hyperlipidemia   IVCD (intraventricular conduction defect)   Chest pain, vague pain, more holding her chest, negative MI   Nausea and vomiting   Malnutrition of moderate degree   Dilated gallbladder   Cholelithiasis with choledocholithiasis   Palliative care encounter   DNR (do not  resuscitate)  Time spent:  CHIU, STEPHEN K  [facility] Pager [phone]. If 7PM-7AM, please contact night-coverage at [email or web address], password [facility] 11/04/2013, 5:11 PM  LOS: 3 days

## 2013-11-12 NOTE — Progress Notes (Signed)
Chaplain followed up with family after palliative care meeting. Chaplain went to room and neither patient nor family were there. Nurse instructed chaplain that family was in second floor waiting area. Family asked for prayer from chaplain. Chaplain made family aware of her services. Will follow as needed.   11-13-2013 1500  Clinical Encounter Type  Visited With Family;Patient not available  Visit Type Follow-up;Spiritual support  Referral From Chaplain;Palliative care team  Spiritual Encounters  Spiritual Needs Emotional;Prayer  Stress Factors  Family Stress Factors Loss  Camar Guyton, Mayer Masker, Chaplain 3:10 PM 2013/11/13

## 2013-11-12 NOTE — Op Note (Addendum)
[facility] [address] [place], [address]   ERCP PROCEDURE REPORT  PATIENT: Pamela Hayes, Pamela Hayes.  MR# :[id number] BIRTHDATE: Dec 19, 1914  GENDER: Female ENDOSCOPIST: Iva Boop, MD, [facility] PROCEDURE DATE:  2013/11/16 PROCEDURE:   ERCP with stent placement ASA CLASS:   Class III INDICATIONS:established bile duct stone(s). MEDICATIONS: See Anesthesia Report. TOPICAL ANESTHETIC: none  DESCRIPTION OF PROCEDURE:   After the risks benefits and alternatives of the procedure were thoroughly explained, informed consent was obtained.  The Pentax Ercp Scope [id number]  endoscope was introduced through the mouth  and advanced to the second portion of the duodenum .  1) Normal stomach, duodenum with periampullary diverticulum 2) papilla on edge of diverticulum and small 3) Cannulation of papilla with wire - massively dilated common bile duct and hepatic ducts seen with contrast injection - and at least 2 stones.  Bile duct is angulated also. - 5 cm 8.5 Fr stent placed with gooddrainage after failure of 10 Fr stent to pass through papilla 4) cystic duct and gallbladder do not fill 5) intrahepatics mildly dilated 6) no pancreas injection The scope was then completely withdrawn from the patient and the procedure terminated.     COMPLICATIONS: .  There were no complications.  ENDOSCOPIC IMPRESSION: 1) massively dilated common bile duct and hepatic ducts seen with contrast injection - and at least 2 stones.  Bile duct is angulated also - stented with 5 cm 8.5 Fr plastic stent 2) cystic duct and gallbladder do not fill 3) intrahepatics mildly dilated   RECOMMENDATIONS: clear liquids continue antibiotics observe - suspect main problem was cholecystitis and hopefully that will respond to antibiotics, not hopeful that his stenting will help that since cystic duct was not filled (presumably occluded) on this exam Long-term plan to be sorted out - re: stent  removal, sphincterotomy with stone extraction vs expectant f/u     eSigned:  Iva Boop, MD, [facility] 16-Nov-2013 3:19 PM Revised: 11-16-13 3:19 PM

## 2013-11-12 NOTE — Transfer of Care (Signed)
Immediate Anesthesia Transfer of Care Note  Patient: Pamela Hayes  Procedure(s) Performed: Procedure(s): ENDOSCOPIC RETROGRADE CHOLANGIOPANCREATOGRAPHY (ERCP) (N/A)  Patient Location: PACU and Endoscopy Unit  Anesthesia Type:General  Level of Consciousness: responds to stimulation  Airway & Oxygen Therapy: Patient Spontanous Breathing and Patient connected to face mask oxygen  Post-op Assessment: Report given to PACU RN and Post -op Vital signs reviewed and stable  Post vital signs: Reviewed and stable  Complications: No apparent anesthesia complications

## 2013-11-12 NOTE — Progress Notes (Signed)
    Attempted to see pt x 2 this morning--each time large group in room having meeting. Plan is to proceed with ERCP. Pt has been off plavix x 2 days and has not had lovenox in 2 days.   Vital signs in last 24 hours: Temp:  [97.7 F (36.5 C)-101.2 F (38.4 C)] 101.2 F (38.4 C) (09/18 0834) Pulse Rate:  [120-147] 130 (09/18 0740) Resp:  [18-20] 20 (09/18 0740) BP: (115-142)/(72-102) 130/102 mmHg (09/18 0740) SpO2:  [95 %-98 %] 96 % (09/18 0740) Weight:  [89 lb 3.2 oz (40.461 kg)] 89 lb 3.2 oz (40.461 kg) (09/18 0404) Last BM Date: 11/11/13  Awake and alert Lungs cta Cor S1S2 Abd soft w/ RUQ tenderness    Recent Labs  10/28/2013 2103 11/10/13 0321 11/11/13 0331  WBC 7.2 9.4 22.6*  HGB 12.4 11.9* 11.6*  HCT 36.4 34.6* 34.0*  PLT 241 228 213   BMET  Recent Labs  11/01/2013 2103 11/10/13 0321 11/11/13 0331  NA 143  141 140 143  K 4.3  4.3 3.3* 4.2  CL 104  102 102 105  CO2 25  24 21 19   GLUCOSE 131*  130* 125* 112*  BUN 17  16 13 18   CREATININE 0.74  0.76 0.63 0.80  CALCIUM 9.4  9.4 8.6 9.0   LFT  Recent Labs  11/11/13 0331  PROT 6.9  ALBUMIN 3.1*  AST 35  ALT 18  ALKPHOS 69  BILITOT 0.6   PT/INR  Recent Labs  10/30/2013 2103  LABPROT 13.5  INR 1.03   Pt with cholelithiasis/choledocholithiais, now with cholangitis with WBC 22. Pt and her family not interested in surgery. Plan is to proceed with ERCP. Pt has been off plavix and lovenox x 2 days.      LOS: 3 days   Hvozdovic, 11/11/13 PA-C 11/14/2013, Pager [phone]  [facility] Attending  I have also seen and assessed the patient and agree with the above note.  Family and patient have decided to go with ERCP. Will at least do stenting - am concerned about bleeding with recent clopidogrel. I have reviewed risks, benefits and indications of ERCP, stenting and stonm removal multiple times/.  11/14/2013, MD, [facility] [phone] (pager) 11/18/2013 1:26 PM

## 2013-11-12 NOTE — Progress Notes (Signed)
[facility] Progress Note     Subjective: Pt in pain, 8 family members at bedside.  Still deciding about proceeding with ERCP.  Pt is sleeping.  Family says she feels uch worse and her pain is persisting.  Objective: Vital signs in last 24 hours: Temp:  [97.7 F (36.5 C)-101.2 F (38.4 C)] 101.2 F (38.4 C) (09/18 0834) Pulse Rate:  [120-147] 130 (09/18 0740) Resp:  [18-20] 20 (09/18 0740) BP: (115-142)/(72-102) 130/102 mmHg (09/18 0740) SpO2:  [95 %-98 %] 96 % (09/18 0740) Weight:  [89 lb 3.2 oz (40.461 kg)] 89 lb 3.2 oz (40.461 kg) (09/18 0404) Last BM Date: 11/11/13  Intake/Output from previous day:   Intake/Output this shift: Total I/O In: -  Out: 1 [Urine:1]  PE: Deferred  Lab Results:   Recent Labs  11/10/13 0321 11/11/13 0331  WBC 9.4 22.6*  HGB 11.9* 11.6*  HCT 34.6* 34.0*  PLT 228 213   BMET  Recent Labs  11/10/13 0321 11/11/13 0331  NA 140 143  K 3.3* 4.2  CL 102 105  CO2 21 19  GLUCOSE 125* 112*  BUN 13 18  CREATININE 0.63 0.80  CALCIUM 8.6 9.0   PT/INR  Recent Labs  11/08/2013 2103  LABPROT 13.5  INR 1.03   CMP     Component Value Date/Time   NA 143 11/11/2013 0331   K 4.2 11/11/2013 0331   CL 105 11/11/2013 0331   CO2 19 11/11/2013 0331   GLUCOSE 112* 11/11/2013 0331   BUN 18 11/11/2013 0331   CREATININE 0.80 11/11/2013 0331   CALCIUM 9.0 11/11/2013 0331   PROT 6.9 11/11/2013 0331   ALBUMIN 3.1* 11/11/2013 0331   AST 35 11/11/2013 0331   ALT 18 11/11/2013 0331   ALKPHOS 69 11/11/2013 0331   BILITOT 0.6 11/11/2013 0331   GFRNONAA 60* 11/11/2013 0331   GFRAA 69* 11/11/2013 0331   Lipase     Component Value Date/Time   LIPASE 9* 11/11/2013 0331       Studies/Results: No results found.  Anti-infectives: Anti-infectives   Start     Dose/Rate Route Frequency Ordered Stop   11/11/13 1400  piperacillin-tazobactam (ZOSYN) IVPB 2.25 g     2.25 g 100 mL/hr over 30 Minutes Intravenous 3 times per day 11/11/13 1238     11/10/13 2200  Ampicillin-Sulbactam (UNASYN) 3 g in sodium chloride 0.9 % 100 mL IVPB  Status:  Discontinued     3 g 100 mL/hr over 60 Minutes Intravenous Every 8 hours 11/10/13 2137 11/11/13 1216   11/10/13 1500  cefTRIAXone (ROCEPHIN) 1 g in dextrose 5 % 50 mL IVPB  Status:  Discontinued    Comments:  Pharmacy may adjust dosing strength / duration / interval for maximal efficacy   1 g 100 mL/hr over 30 Minutes Intravenous Every 24 hours 11/10/13 1333 11/10/13 2137   11/11/2013 2230  piperacillin-tazobactam (ZOSYN) IVPB 3.375 g     3.375 g 12.5 mL/hr over 240 Minutes Intravenous  Once 11/24/2013 2221 11/10/13 0412   11/12/2013 2230  vancomycin (VANCOCIN) IVPB 1000 mg/200 mL premix     1,000 mg 200 mL/hr over 60 Minutes Intravenous  Once 10/28/2013 2221 11/10/13 0012       Assessment/Plan Cholelithiasis  Choledocholithiasis  Now with cholecystitis - WBC 22  Multiple medical problems including CAD, h/o TIA, PVD   Plan:  1. IVF, pain control, IV antibiotics  2. GI following  3. Surgery is off the table for the patient and her  family. They understand the risks associated with not operating and the pain she may be in if she has another stone pass/biliary colic.  4. Family at bedside met with palliative and the majority wants to proceed with ERCP, Stanton Kidney from palliative will discuss with Dr. Carlean Purl     LOS: 3 days    DORT, [facility] 11/01/2013, 11:32 AM Pager: [phone]

## 2013-11-12 NOTE — Consult Note (Signed)
I have reviewed this case with our NP and agree with the Assessment and Plan as stated.  Sapir Lavey L. Cherri Yera, MD MBA The Palliative Medicine Team at  Team Phone: [phone] Pager: [phone]   

## 2013-11-12 NOTE — Progress Notes (Signed)
Pt temp rectally 101.2.  MD notified, orders placed.  Will carry out MD orders and continue to monitor.

## 2013-11-12 NOTE — Anesthesia Postprocedure Evaluation (Signed)
  Anesthesia Post-op Note  Patient: Pamela Hayes  Procedure(s) Performed: Procedure(s): ENDOSCOPIC RETROGRADE CHOLANGIOPANCREATOGRAPHY (ERCP) (N/A)  Patient Location: PACU  Anesthesia Type:General  Level of Consciousness: awake, sedated and patient cooperative  Airway and Oxygen Therapy: Patient Spontanous Breathing  Post-op Pain: none  Post-op Assessment: Post-op Vital signs reviewed, Patient's Cardiovascular Status Stable, Respiratory Function Stable, Patent Airway and No signs of Nausea or vomiting  Post-op Vital Signs: stable  Last Vitals:  Filed Vitals:   10/28/2013 1435  BP: 148/76  Pulse: 104  Temp: 37.4 C  Resp: 17    Complications: No apparent anesthesia complications

## 2013-11-12 NOTE — Progress Notes (Signed)
Pt arrived back from endo.  Pt sleeping comfortably, responds to voice.  Will continue to monitor pt.

## 2013-11-12 NOTE — Progress Notes (Signed)
Does not want surgery will sign off

## 2013-11-12 NOTE — Progress Notes (Signed)
Visited with pt's 6 daughters and 1 son. All seemed to share agreement that pt would not want surgery done. There was some division regarding attitudes towards healing; some wanted to pray for healing, others felt more comfortable letting it be "in God's hands." All are concerned about pt's pain/suffering. Chaplain offered prayer for strength, ease of suffering/peace. I will refer this case to other chaplains so that one may check in with them, if not at [facility] meeting at 11am then later this afternoon.    11/14/2013 0900  Clinical Encounter Type  Visited With Patient and family together  Visit Type Spiritual support  Referral From Nurse  Consult/Referral To [facility]  Spiritual Encounters  Spiritual Needs Prayer;Emotional    Wille Glaser 10/30/2013 9:28 AM

## 2013-11-12 NOTE — Anesthesia Preprocedure Evaluation (Signed)
Anesthesia Evaluation  Patient identified by MRN, date of birth, ID band Patient awake    Reviewed: Allergy & Precautions, H&P , NPO status , Patient's Chart, lab work & pertinent test results  Airway       Dental   Pulmonary          Cardiovascular hypertension, + CAD and + Peripheral Vascular Disease + dysrhythmias     Neuro/Psych TIA   GI/Hepatic   Endo/Other    Renal/GU      Musculoskeletal  (+) Arthritis -,   Abdominal   Peds  Hematology   Anesthesia Other Findings   Reproductive/Obstetrics                           Anesthesia Physical Anesthesia Plan  ASA: III  Anesthesia Plan: General   Post-op Pain Management:    Induction: Intravenous  Airway Management Planned: Oral ETT  Additional Equipment:   Intra-op Plan:   Post-operative Plan: Extubation in OR  Informed Consent: I have reviewed the patients History and Physical, chart, labs and discussed the procedure including the risks, benefits and alternatives for the proposed anesthesia with the patient or authorized representative who has indicated his/her understanding and acceptance.     Plan Discussed with: CRNA, Anesthesiologist and Surgeon  Anesthesia Plan Comments:         Anesthesia Quick Evaluation

## 2013-11-12 NOTE — Consult Note (Signed)
Patient Pamela Hayes      DOB: 1914/12/31      GEX:[id number]     Consult Note from the Palliative Medicine Team at [facility]    Consult Requested by: Dr Rhona Leavens    PCP: Georgianne Fick, MD Reason for Consultation: Clarification of GOC and options    Phone Number:[phone]  Assessment of patients Current state:   Admitted with severe RUQ pain and nausea and vomiting.  The CT scan of the ABD returned revealing Multiple Gallstones with dilatation of the biliary Duct inferring Obstruction.  Patient and family faced with treatment decisions within context of goals of care.   Consult is for review of medical treatment options, clarification of goals of care, medical treatment options anticipatroy care needs  and symptom management  recommendation.  This NP Lorinda Creed reviewed medical records, received report from team, assessed the patient and then meet at the patient's bedside along with her family to include her children, five daughters and one son  to discuss diagnosis prognosis, GOC, advanced directives decsions options.  A detailed discussion was had today regarding advanced directives.The difference between a aggressive medical intervention path  and a palliative comfort care path for this patient at this time was had.  Values and goals of care important to patient and family were attempted to be elicited.  Concept of Palliative Care was discussed    Questions and concerns addressed.  Family encouraged to call with questions or concerns.  PMT will continue to support holistically.   Goals of Care: 1.  Code Status:  DNR/DNI-discussed suspending for surgical intervetnions   2. Scope of Treatment: At this time family expresses interest in proceeding with ERCP in hopes of improvement and return to baseline, along with symptom relief.  3. Disposition:  Dependant on outcomes, hope is to return home  with family    4. Symptom Management:   1. Abdominal Pain:  Dilaudid 0.5  mg IV every 2 hrs prn                  Duragesic Patch as previously written still in place Position for comfort  5. Psychosocial:  Emotional support offered to family at bedside.  All but one sibling is in agreement with treatment plan; however she is able to concede and all are hopeful for improvement with comfort as a main focus of care.  Family encouraged to continue open communication regarding values and goals important to patient, thereby enhancing patient  centered care.   Comfort and dignity are priorities of care.    Brief HPI:   Pamela Hayes is a 80 y.o. female with a history of CAD, Hyperlipidemia, HTN,pvd, and previous DVT of the RLE who was brought to the ED due to sudden onset of constant severe 10/10 RUQ and Epigastric Area pain with nausea and vomiting   Reported  poor appetite and weight loss over the past year .  In the ED, she was evaluated and found to have pain on examination with guarding and rebound. A surgery consultation was placed. She was sent for a CT scan and and [place] was also ordered. The CT scan of the ABD returned revealing Multiple Gallstones with dilatation of the biliary Duct inferring Obstruction. She was  Admitted,    Decision today is for ERCP    ROS: intermittent abdominal pain   PMH:  Past Medical History  Diagnosis Date  . Heart murmur   . Coronary artery disease   . Hypertension   .  High cholesterol   . LVH (left ventricular hypertrophy) due to hypertensive disease   . DVT (deep venous thrombosis)     "right" (12/04/2012)  . Exertional shortness of breath   . TIA (transient ischemic attack)     "she's had several; all awhile back" (12/04/2012)  . Arthritis     "joints" (12/04/2012)  . PVD (peripheral vascular disease)   . Tachycardia 12/05/2012  . Arterial disease      PSH: Past Surgical History  Procedure Laterality Date  . Cataract extraction w/ intraocular lens  implant, bilateral Bilateral    I have reviewed the FH and SH  and  If appropriate update it with new information. No Known Allergies Scheduled Meds: . aspirin EC  81 mg Oral Daily  . atenolol  25 mg Oral Daily  . brimonidine  1 drop Both Eyes BID  . calcium carbonate  2 tablet Oral Q breakfast  . dorzolamide-timolol  1 drop Both Eyes BID  . enoxaparin (LOVENOX) injection  20 mg Subcutaneous Q24H  . feeding supplement (RESOURCE BREEZE)  1 Container Oral TID WC  . fentaNYL  12.5 mcg Transdermal Q72H  . piperacillin-tazobactam (ZOSYN)  IV  2.25 g Intravenous 3 times per day  . polyethylene glycol  17 g Oral Daily  . pravastatin  40 mg Oral q1800  . verapamil  180 mg Oral Daily   Continuous Infusions: . sodium chloride 100 mL/hr at 11/04/2013 1118   PRN Meds:.acetaminophen, acetaminophen, alum & mag hydroxide-simeth, HYDROmorphone (DILAUDID) injection, ibuprofen, nitroGLYCERIN, ondansetron (ZOFRAN) IV, ondansetron, oxyCODONE    BP 130/102  Pulse 130  Temp(Src) 101.2 F (38.4 C) (Rectal)  Resp 20  Ht 4\' 8"  (1.422 m)  Wt 40.461 kg (89 lb 3.2 oz)  BMI 20.01 kg/m2  SpO2 96%      Intake/Output Summary (Last 24 hours) at 11/14/2013 1155 Last data filed at 11/06/2013 0740  Gross per 24 hour  Intake      0 ml  Output      1 ml  Net     -1 ml     Physical Exam:  General:  Ill appearing, frail, resting quietly at present  HEENT:  Moist buccal membraens Chest:  CTA CVS: Tachycardic Abdomen: generalized tenderness on gentle palpation Ext: without edema Neuro:  Awakens to verbal stimuli, moves all four extremities   Labs: CBC    Component Value Date/Time   WBC 22.6* 11/11/2013 0331   RBC 3.68* 11/11/2013 0331   HGB 11.6* 11/11/2013 0331   HCT 34.0* 11/11/2013 0331   PLT 213 11/11/2013 0331   MCV 92.4 11/11/2013 0331   MCH 31.5 11/11/2013 0331   MCHC 34.1 11/11/2013 0331   RDW 13.5 11/11/2013 0331   LYMPHSABS 1.5 12/04/2012 0924   MONOABS 0.7 12/04/2012 0924   EOSABS 0.2 12/04/2012 0924   BASOSABS 0.0 12/04/2012 0924    BMET     Component Value Date/Time   NA 143 11/11/2013 0331   K 4.2 11/11/2013 0331   CL 105 11/11/2013 0331   CO2 19 11/11/2013 0331   GLUCOSE 112* 11/11/2013 0331   BUN 18 11/11/2013 0331   CREATININE 0.80 11/11/2013 0331   CALCIUM 9.0 11/11/2013 0331   GFRNONAA 60* 11/11/2013 0331   GFRAA 69* 11/11/2013 0331    CMP     Component Value Date/Time   NA 143 11/11/2013 0331   K 4.2 11/11/2013 0331   CL 105 11/11/2013 0331   CO2 19 11/11/2013 0331   GLUCOSE 112*  11/11/2013 0331   BUN 18 11/11/2013 0331   CREATININE 0.80 11/11/2013 0331   CALCIUM 9.0 11/11/2013 0331   PROT 6.9 11/11/2013 0331   ALBUMIN 3.1* 11/11/2013 0331   AST 35 11/11/2013 0331   ALT 18 11/11/2013 0331   ALKPHOS 69 11/11/2013 0331   BILITOT 0.6 11/11/2013 0331   GFRNONAA 60* 11/11/2013 0331   GFRAA 69* 11/11/2013 0331       Time In Time Out Total Time Spent with Patient Total Overall Time  1000 1115 70 min 75 min    Greater than 50%  of this time was spent counseling and coordinating care related to the above assessment and plan.   Lorinda Creed NP  Palliative Medicine Team Team Phone # [phone] Pager [phone]  Discussed with Dr Leone Payor

## 2013-11-12 NOTE — Progress Notes (Signed)
Thank you for consulting the Palliative Medicine Team at [facility] to meet your patient's and family's needs.   The reason that you asked [place] to see your patient is  For  Clarification of GOC and options  We have scheduled your patient for a meeting: Today 23-Nov-2013 at 11:00  Five of her children plan to attend  Lorinda Creed NP  Palliative Medicine Team Team Phone # [phone] Pager [phone]

## 2013-11-13 DIAGNOSIS — Z66 Do not resuscitate: Secondary | ICD-10-CM

## 2013-11-13 DIAGNOSIS — G929 Unspecified toxic encephalopathy: Secondary | ICD-10-CM

## 2013-11-13 DIAGNOSIS — I779 Disorder of arteries and arterioles, unspecified: Secondary | ICD-10-CM

## 2013-11-13 DIAGNOSIS — K810 Acute cholecystitis: Secondary | ICD-10-CM

## 2013-11-13 DIAGNOSIS — R000 Tachycardia, unspecified: Secondary | ICD-10-CM

## 2013-11-13 DIAGNOSIS — G92 Toxic encephalopathy: Secondary | ICD-10-CM

## 2013-11-13 DIAGNOSIS — F05 Delirium due to known physiological condition: Secondary | ICD-10-CM

## 2013-11-13 LAB — CBC
HCT: 35.5 % — ABNORMAL LOW (ref 36.0–46.0)
Hemoglobin: 12.3 g/dL (ref 12.0–15.0)
MCH: 32 pg (ref 26.0–34.0)
MCHC: 34.6 g/dL (ref 30.0–36.0)
MCV: 92.4 fL (ref 78.0–100.0)
Platelets: 256 10*3/uL (ref 150–400)
RBC: 3.84 MIL/uL — ABNORMAL LOW (ref 3.87–5.11)
RDW: 14 % (ref 11.5–15.5)
WBC: 19.1 10*3/uL — ABNORMAL HIGH (ref 4.0–10.5)

## 2013-11-13 MED ORDER — KETOROLAC TROMETHAMINE 15 MG/ML IJ SOLN
15.0000 mg | Freq: Four times a day (QID) | INTRAMUSCULAR | Status: DC | PRN
  Administered 2013-11-13: 15 mg via INTRAVENOUS
  Filled 2013-11-13: qty 1

## 2013-11-13 MED ORDER — SODIUM CHLORIDE 0.9 % IV BOLUS (SEPSIS)
250.0000 mL | Freq: Once | INTRAVENOUS | Status: AC
  Administered 2013-11-13: 250 mL via INTRAVENOUS

## 2013-11-13 MED ORDER — ACETAMINOPHEN 325 MG PO TABS: 650.0000 mg | ORAL_TABLET | Freq: Four times a day (QID) | ORAL | Status: DC | PRN

## 2013-11-13 MED ORDER — KETOROLAC TROMETHAMINE 30 MG/ML IJ SOLN
INTRAMUSCULAR | Status: AC
  Administered 2013-11-13: 30 mg
  Filled 2013-11-13: qty 1

## 2013-11-13 MED ORDER — ACETAMINOPHEN 650 MG RE SUPP
650.0000 mg | RECTAL | Status: DC | PRN
  Administered 2013-11-13 – 2013-11-15 (×6): 650 mg via RECTAL
  Filled 2013-11-13 (×7): qty 1

## 2013-11-13 MED ORDER — LORAZEPAM 2 MG/ML IJ SOLN
0.5000 mg | Freq: Once | INTRAMUSCULAR | Status: AC
  Administered 2013-11-13: 0.5 mg via INTRAVENOUS
  Filled 2013-11-13: qty 1

## 2013-11-13 MED ORDER — LORAZEPAM 2 MG/ML IJ SOLN
0.5000 mg | Freq: Four times a day (QID) | INTRAMUSCULAR | Status: DC | PRN
  Administered 2013-11-14 – 2013-11-15 (×2): 0.5 mg via INTRAVENOUS
  Filled 2013-11-13 (×2): qty 1

## 2013-11-13 NOTE — Progress Notes (Signed)
Pt's HR up to 170's-190's non sustained, pt resting comfortably in bed. Scheduled metoprolol 5mg  IV given, family at bedside. Will continue to monitor.

## 2013-11-13 NOTE — Progress Notes (Signed)
Patient is very agitated and confused, with facial grimacaing and moaning noted. Patient repositioned in bed and Tylenol to be administered by rectum for pain. Discussed the decline in patient's condition over the past month and the possibility of considering comfort care only for patient. Patient's family verbalized understanding, and stated that they are in agreement with patient receiving pain medication to keep her comfortable as well as they are in agreement with patient receiving Ativan and or Haldol to help manage the patient's agitation. This writer verbalized understanding and informed family that I would notify the physician of the above mentioned changes,.  Sharlene Dory, RN

## 2013-11-13 NOTE — Progress Notes (Signed)
Pt calling out for "mama", restless. Family will not allow me to give any further pain meds. Multiple family members at bedside

## 2013-11-13 NOTE — Progress Notes (Addendum)
Patient EP:PIRJJO H Will      DOB: Sep 22, 1914      ACZ:[id number]   Palliative Medicine Team at [facility] Progress Note    Subjective: Patient with delirium and agitation.  Attempted to explain to family at the bedside that this is likely multifactorial-  Infection, medication, etc.  Family only focused on "the strong pain medications."  Patient has had very little medication as family refusing.  Explained that I would like to discontinue fentanyl patch which can cause increased confusion and use low dose IV opiates with tylenol and Toradol.  Patient would also benefit from low dose haldol or ativan but family refusing to consider medications that will cause sedation citing "she need to see all her children."  I have asked the nurse  coming on to try to speak with them about better symptom management as I believe it can be achieved.    Filed Vitals:   11/13/13 1300  BP: 187/96  Pulse: 106  Temp: 97.8 F (36.6 C)  Resp: 18   Physical exam:  Generally agitated calling out, talking to her mother and other relatives not present,not conslable  Chest decreased , no rhonchi, rales of wheezes Abdomen not distended hard to tell if she is tender because she is so agitated Ext : warm, dry , no mottling Neuro: confused and agitated  Elevated WBC count 19k (22k) Urine was positive on 9/15 (klebsiella) sensitive to quinolones and ceftriaxone    Assessment and plan: 80 yr old with acute delirium /encephalopathy, toxic due to infection and possible reaction to medications.  At this time will see if stopping fentanyl and using alternative iv and rectal pain medications will help any.  Patient would benefit from low dose haldol and ativan but family against sedating meds and want to try get her calm by holding and talking with her.  They are having a hard time understanding the nature of her illness and will need further goals of care to help with possible transition to comfort.  1.  DNR  2.   Delirium/encephalopathy toxi  3.  Pain: dc fentanyl may be exacerbating delirium. Use Toradol,tylenol and if family will allow prn dilaudid.   4.  Nausea: gaging. Give zofran 5. Tachycardia : prn metoprolol, hydrating. Monitor for volume overload.  Total time:  650-734 pm

## 2013-11-13 NOTE — Treatment Plan (Signed)
Met with family this afternoon for update. Pt's HR has been reaching the 170's in the setting of abd pain and concerns for sepsis. Pt has thus far tolerated 125cc/hr without ill effects. Given concerns of sepsis, would continue 125cc/hr and will give an additional 250cc fluid bolus x 1 for volume resuscitation. In addition, toradol was ordered for additional pain management before fentanyl patch takes effect. Family is aware of risks of toradol including chance of acute renal impairment in this fail elderly female. Family is in agreement with additional toradol PRN. Pt's condition remains quite guarded and family is aware of this. Plan thus far, remains as outlined previously.

## 2013-11-13 NOTE — Progress Notes (Signed)
   Abdominal pain seems less Agitated over getting a new IV now She is declining labs  BP 187/96  Pulse 106  Temp(Src) 97.8 F (36.6 C) (Axillary)  Resp 18  Ht 4\' 8"  (1.422 m)  Wt 91 lb (41.277 kg)  BMI 20.41 kg/m2  SpO2 99%  Awake Abdomen is soft with mild-mod RUQ tenderness  No labs today  A/P  Choledocholithiasis with cholecystitis S/p biliary stent 80 years old  Continue abx for cholecystitis Will follow Gallbladder drainage would benefit her but she and family did not want Time will tell if this Tx plan will work  94, MD, [facility] [phone] (pager) 11/13/2013 3:22 PM

## 2013-11-13 NOTE — Progress Notes (Signed)
[facility] PROGRESS NOTE  Pamela Hayes JOI:[id number] DOB: 08/10/1914 DOA: 10/27/2013 PCP: Pamela Fick, MD  Assessment/Plan: 1. Abdominal pain, acute secondary to choledocholithiasis and cholecystitis with sepsis  -CT ABD revealing Multiple Gallstones with Obstruction of the Biliary duct  -Pt initially seen by General Surgery but pt had declined surgery -GI also following -Pain Control with IVDilaudid, Low dose fentanyl patch added -Pt is now s/p ERCP with massively dilated cbd and hepatic ducts with at least 2 stones seen. Stent placed - Leukocytosis to 22.6k noted as of 9/18 with fevers - follow cbc - Pt is continued on Unasyn - Pt cont with tachycardia overnight - will increase IVF to 125cc/hr 2. Nausea and vomiting  -PRN IV anti-emetics  3. HTN (hypertension)  -IV Hydralazine PRN SBP > 160  -Difficult for pt to tolerate PO meds given above -On IV lopressor scheduled 4. Hyperlipidemia  -Continue Pravastatin Rx if/when pt can tolerate PO meds 5. IVCD (intraventricular conduction defect)  -Has pacemaker  6. DVT Prophylaxis  -SCD's  Code Status: DNR Family Communication: Pt and family in room Disposition Plan: Pending   Consultants:  Surgery  GI  Procedures:    Antibiotics:  none  HPI/Subjective: Continues with pain, partially improved with pain meds  Objective: Filed Vitals:   11/13/13 0014 11/13/13 0310 11/13/13 0509 11/13/13 1300  BP: 169/92  185/96 187/96  Pulse: 104  117 106  Temp:  99.6 F (37.6 C) 99.1 F (37.3 C) 97.8 F (36.6 C)  TempSrc:   Oral Axillary  Resp:   18 18  Height:      Weight:   41.277 kg (91 lb)   SpO2:   97% 99%    Intake/Output Summary (Last 24 hours) at 11/13/13 1423 Last data filed at 11/13/13 1353  Gross per 24 hour  Intake   1210 ml  Output      0 ml  Net   1210 ml   Filed Weights   11/11/13 0646 11/04/2013 0404 11/13/13 0509  Weight: 39.463 kg (87 lb) 40.461 kg (89 lb 3.2 oz) 41.277 kg (91 lb)     Exam:   General:  Awake, laying in bed grimacing in pain  Cardiovascular: regular, s1, s2  Respiratory: normal resp effort, no wheezing  Abdomen: soft,nondistended, RUQ abd tenderness  Musculoskeletal: perfused, no clubbing   Data Reviewed: Basic Metabolic Panel:  Recent Labs Lab 11/21/2013 2103 11/10/13 0321 11/11/13 0331  NA 143  141 140 143  K 4.3  4.3 3.3* 4.2  CL 104  102 102 105  CO2 25  24 21 19   GLUCOSE 131*  130* 125* 112*  BUN 17  16 13 18   CREATININE 0.74  0.76 0.63 0.80  CALCIUM 9.4  9.4 8.6 9.0   Liver Function Tests:  Recent Labs Lab 11/08/2013 2103 11/11/13 0331  AST 18 35  ALT 8 18  ALKPHOS 58 69  BILITOT 0.2* 0.6  PROT 7.2 6.9  ALBUMIN 3.5 3.1*    Recent Labs Lab 11/24/2013 2103 11/11/13 0331  LIPASE 30 9*  AMYLASE  --  17   No results found for this basename: AMMONIA,  in the last 168 hours CBC:  Recent Labs Lab 11/11/2013 2103 11/10/13 0321 11/11/13 0331  WBC 7.2 9.4 22.6*  HGB 12.4 11.9* 11.6*  HCT 36.4 34.6* 34.0*  MCV 92.2 91.5 92.4  PLT 241 228 213   Cardiac Enzymes: No results found for this basename: CKTOTAL, CKMB, CKMBINDEX, TROPONINI,  in the last 168 hours BNP (  last 3 results)  Recent Labs  12/04/13 2103  PROBNP 850.9*   CBG: No results found for this basename: GLUCAP,  in the last 168 hours  Recent Results (from the past 240 hour(s))  URINE CULTURE     Status: None   Collection Time    2013-12-04  9:34 PM      Result Value Ref Range Status   Specimen Description URINE, CATHETERIZED   Final   Special Requests ADDED [id number]   Final   Culture  Setup Time     Final   Value: 11/10/2013 03:19     Performed at [facility]   Colony Count     Final   Value: >=100,000 COLONIES/ML     Performed at [facility]   Culture     Final   Value: KLEBSIELLA PNEUMONIAE     Performed at [facility]   Report Status 11/09/2013 FINAL   Final   Organism ID, Bacteria KLEBSIELLA  PNEUMONIAE   Final     Studies: Dg Ercp  11/19/2013   CLINICAL DATA:  Choledocholithiasis  EXAM: ERCP with sphincterotomy and placement of a plastic biliary stent  TECHNIQUE: Multiple spot images obtained with the fluoroscopic device and submitted for interpretation post-procedure.  COMPARISON:  CT abdomen/ pelvis 11/10/2013  FINDINGS: Multiple intraoperative spot images were obtained at the time of the ERCP. The images depict a flexible endoscope in the descending duodenum with wire cannulation of the common bile duct. Cholangiogram demonstrates intra and extrahepatic biliary ductal dilatation. There is at least 1 ovoid filling defect consistent with choledocholithiasis. The final image demonstrates placement of a plastic biliary stent.  IMPRESSION: 1. Choledocholithiasis with intra and extrahepatic biliary ductal dilatation. 2. Placement of a plastic biliary stent. These images were submitted for radiologic interpretation only. Please see the procedural report for the amount of contrast and the fluoroscopy time utilized.   Electronically Signed   By: Malachy Moan M.D.   On: 11/22/2013 14:48    Scheduled Meds: . aspirin EC  81 mg Oral Daily  . brimonidine  1 drop Both Eyes BID  . calcium carbonate  2 tablet Oral Q breakfast  . dorzolamide-timolol  1 drop Both Eyes BID  . enoxaparin (LOVENOX) injection  20 mg Subcutaneous Q24H  . feeding supplement (RESOURCE BREEZE)  1 Container Oral TID WC  . fentaNYL  12.5 mcg Transdermal Q72H  . metoprolol  5 mg Intravenous 4 times per day  . piperacillin-tazobactam (ZOSYN)  IV  2.25 g Intravenous 3 times per day  . polyethylene glycol  17 g Oral Daily  . pravastatin  40 mg Oral q1800  . verapamil  180 mg Oral Daily   Continuous Infusions: . sodium chloride 125 mL/hr at 11/13/13 1345    Principal Problem:   Abdominal pain, acute Active Problems:   HTN (hypertension)   Hyperlipidemia   IVCD (intraventricular conduction defect)   Chest pain,  vague pain, more holding her chest, negative MI   Nausea and vomiting   Malnutrition of moderate degree   Dilated gallbladder   Cholelithiasis with choledocholithiasis   Palliative care encounter   DNR (do not resuscitate)  Time spent:  Pamela Hayes K  [facility] Pager [phone]. If 7PM-7AM, please contact night-coverage at [email or web address], password [facility] 11/13/2013, 2:23 PM  LOS: 4 days

## 2013-11-14 DIAGNOSIS — A419 Sepsis, unspecified organism: Secondary | ICD-10-CM

## 2013-11-14 LAB — CBC WITH DIFFERENTIAL/PLATELET
BASOS PCT: 0 % (ref 0–1)
Basophils Absolute: 0 10*3/uL (ref 0.0–0.1)
EOS PCT: 0 % (ref 0–5)
Eosinophils Absolute: 0 10*3/uL (ref 0.0–0.7)
HCT: 33.4 % — ABNORMAL LOW (ref 36.0–46.0)
Hemoglobin: 11.3 g/dL — ABNORMAL LOW (ref 12.0–15.0)
LYMPHS ABS: 0.8 10*3/uL (ref 0.7–4.0)
Lymphocytes Relative: 4 % — ABNORMAL LOW (ref 12–46)
MCH: 31.1 pg (ref 26.0–34.0)
MCHC: 33.8 g/dL (ref 30.0–36.0)
MCV: 92 fL (ref 78.0–100.0)
Monocytes Absolute: 1.6 10*3/uL — ABNORMAL HIGH (ref 0.1–1.0)
Monocytes Relative: 8 % (ref 3–12)
NEUTROS PCT: 88 % — AB (ref 43–77)
Neutro Abs: 17.6 10*3/uL — ABNORMAL HIGH (ref 1.7–7.7)
PLATELETS: 237 10*3/uL (ref 150–400)
RBC: 3.63 MIL/uL — AB (ref 3.87–5.11)
RDW: 14 % (ref 11.5–15.5)
WBC: 20 10*3/uL — ABNORMAL HIGH (ref 4.0–10.5)

## 2013-11-14 LAB — LIPASE, BLOOD: LIPASE: 106 U/L — AB (ref 11–59)

## 2013-11-14 LAB — COMPREHENSIVE METABOLIC PANEL
ALT: 22 U/L (ref 0–35)
AST: 34 U/L (ref 0–37)
Albumin: 2.2 g/dL — ABNORMAL LOW (ref 3.5–5.2)
Alkaline Phosphatase: 108 U/L (ref 39–117)
Anion gap: 20 — ABNORMAL HIGH (ref 5–15)
BUN: 26 mg/dL — ABNORMAL HIGH (ref 6–23)
CALCIUM: 8.2 mg/dL — AB (ref 8.4–10.5)
CO2: 15 mEq/L — ABNORMAL LOW (ref 19–32)
Chloride: 118 mEq/L — ABNORMAL HIGH (ref 96–112)
Creatinine, Ser: 0.88 mg/dL (ref 0.50–1.10)
GFR calc Af Amer: 62 mL/min — ABNORMAL LOW (ref >90)
GFR calc non Af Amer: 53 mL/min — ABNORMAL LOW (ref >90)
Glucose, Bld: 118 mg/dL — ABNORMAL HIGH (ref 70–99)
Potassium: 3.4 mEq/L — ABNORMAL LOW (ref 3.7–5.3)
Sodium: 153 mEq/L — ABNORMAL HIGH (ref 137–147)
TOTAL PROTEIN: 6.2 g/dL (ref 6.0–8.3)
Total Bilirubin: 0.6 mg/dL (ref 0.3–1.2)

## 2013-11-14 LAB — AMYLASE: AMYLASE: 76 U/L (ref 0–105)

## 2013-11-14 NOTE — Progress Notes (Signed)
[facility] PROGRESS NOTE  Pamela Hayes HFW:[id number] DOB: 1914-04-22 DOA: 10/30/2013 PCP: Georgianne Fick, MD  Assessment/Plan: 1. Abdominal pain, acute secondary to choledocholithiasis and cholecystitis with sepsis  -CT ABD revealing Multiple Gallstones with Obstruction of the Biliary duct  -Pt was initially seen by General Surgery but pt and family had declined surgery -GI also consulted -Pain Control with IVDilaudid, Low dose fentanyl patch added but patch d/c'd secondary to concerns of increased delerium -Pt is now s/p ERCP with massively dilated cbd and hepatic ducts with at least 2 stones seen. Stent placed - Leukocytosis stable at around 20k noted as of 9/18 with high fevers - Pt is continued on zosyn - Pt remains tachycardic - cont IVF at 125cc/hr -Overall grim prognosis. Appreciate Palliative Care input. See below 2. Nausea and vomiting  -PRN IV anti-emetics  3. HTN (hypertension)  -IV Hydralazine PRN SBP > 160  -Difficult for pt to tolerate PO meds given above -On IV lopressor scheduled 4. Hyperlipidemia  -Continue Pravastatin Rx if/when pt can tolerate PO meds 5. IVCD (intraventricular conduction defect)  -Has pacemaker  6. DVT Prophylaxis  -SCD's 7. End of Life - Family meeting with majority of family including POA this afternoon. Family updated on patient's grave prognosis. Family now aware of pt's worsening septic picture despite several days course of IV abx and stent placement. Family again declines any kind of surgical intervention, including percutaneous tube placement. Family is now in agreement with home with hospice. Will discuss with SW and with Palliative Care tomorrow. For now, cont with supportive measures. Pt is DNR/DNI.  Code Status: DNR Family Communication: Pt and family at bedside Disposition Plan: Pending   Consultants:  Surgery  GI  Procedures:    Antibiotics:  Zosyn  HPI/Subjective: Pt moans and grimaces in  pain.  Objective: Filed Vitals:   11/13/13 2022 11/14/13 0615 11/14/13 0940 11/14/13 1609  BP: 147/93 166/83 194/93 149/81  Pulse: 114 104 134 133  Temp: 99.4 F (37.4 C) 102.1 F (38.9 C) 98.8 F (37.1 C) 99.2 F (37.3 C)  TempSrc: Oral Axillary Axillary Axillary  Resp: 18 17 18 16   Height:      Weight:  41.912 kg (92 lb 6.4 oz)    SpO2: 98% 100% 100% 98%   No intake or output data in the 24 hours ending 11/14/13 1803 Filed Weights   11/16/2013 0404 11/13/13 0509 11/14/13 0615  Weight: 40.461 kg (89 lb 3.2 oz) 41.277 kg (91 lb) 41.912 kg (92 lb 6.4 oz)    Exam:   General:  Awake, laying in bed grimacing in pain  Cardiovascular: regular, s1, s2  Respiratory: normal resp effort, no wheezing  Abdomen: soft,nondistended, RUQ abd tenderness  Musculoskeletal: perfused, no clubbing   Data Reviewed: Basic Metabolic Panel:  Recent Labs Lab 11/06/2013 2103 11/10/13 0321 11/11/13 0331 11/14/13 0450  NA 143  141 140 143 153*  K 4.3  4.3 3.3* 4.2 3.4*  CL 104  102 102 105 118*  CO2 25  24 21 19  15*  GLUCOSE 131*  130* 125* 112* 118*  BUN 17  16 13 18  26*  CREATININE 0.74  0.76 0.63 0.80 0.88  CALCIUM 9.4  9.4 8.6 9.0 8.2*   Liver Function Tests:  Recent Labs Lab 11/16/2013 2103 11/11/13 0331 11/14/13 0450  AST 18 35 34  ALT 8 18 22   ALKPHOS 58 69 108  BILITOT 0.2* 0.6 0.6  PROT 7.2 6.9 6.2  ALBUMIN 3.5 3.1* 2.2*    Recent  Labs Lab 11/02/2013 2103 11/11/13 0331 11/14/13 0450  LIPASE 30 9* 106*  AMYLASE  --  17 76   No results found for this basename: AMMONIA,  in the last 168 hours CBC:  Recent Labs Lab 11/11/2013 2103 11/10/13 0321 11/11/13 0331 11/13/13 1527 11/14/13 0450  WBC 7.2 9.4 22.6* 19.1* 20.0*  NEUTROABS  --   --   --   --  17.6*  HGB 12.4 11.9* 11.6* 12.3 11.3*  HCT 36.4 34.6* 34.0* 35.5* 33.4*  MCV 92.2 91.5 92.4 92.4 92.0  PLT 241 228 213 256 237   Cardiac Enzymes: No results found for this basename: CKTOTAL, CKMB,  CKMBINDEX, TROPONINI,  in the last 168 hours BNP (last 3 results)  Recent Labs  11/14/2013 2103  PROBNP 850.9*   CBG: No results found for this basename: GLUCAP,  in the last 168 hours  Recent Results (from the past 240 hour(s))  URINE CULTURE     Status: None   Collection Time    10/26/2013  9:34 PM      Result Value Ref Range Status   Specimen Description URINE, CATHETERIZED   Final   Special Requests ADDED [id number]   Final   Culture  Setup Time     Final   Value: 11/10/2013 03:19     Performed at [facility]   Colony Count     Final   Value: >=100,000 COLONIES/ML     Performed at [facility]   Culture     Final   Value: KLEBSIELLA PNEUMONIAE     Performed at [facility]   Report Status 11/05/2013 FINAL   Final   Organism ID, Bacteria KLEBSIELLA PNEUMONIAE   Final     Studies: No results found.  Scheduled Meds: . aspirin EC  81 mg Oral Daily  . brimonidine  1 drop Both Eyes BID  . calcium carbonate  2 tablet Oral Q breakfast  . dorzolamide-timolol  1 drop Both Eyes BID  . enoxaparin (LOVENOX) injection  20 mg Subcutaneous Q24H  . feeding supplement (RESOURCE BREEZE)  1 Container Oral TID WC  . metoprolol  5 mg Intravenous 4 times per day  . piperacillin-tazobactam (ZOSYN)  IV  2.25 g Intravenous 3 times per day  . polyethylene glycol  17 g Oral Daily  . verapamil  180 mg Oral Daily   Continuous Infusions:    Principal Problem:   Abdominal pain, acute Active Problems:   HTN (hypertension)   Hyperlipidemia   IVCD (intraventricular conduction defect)   Chest pain, vague pain, more holding her chest, negative MI   Nausea and vomiting   Malnutrition of moderate degree   Cholecystitis, acute   Cholelithiasis with choledocholithiasis   Palliative care encounter   DNR (do not resuscitate)  Time spent:  Trevyn Lumpkin K  [facility] Pager [phone]. If 7PM-7AM, please contact night-coverage at [email or web address], password  [facility] 11/14/2013, 6:03 PM  LOS: 5 days

## 2013-11-14 NOTE — Progress Notes (Signed)
Temp 103.1 . Tylenol supp given .

## 2013-11-14 NOTE — Progress Notes (Signed)
Patient is sleeping peacefully. She is sinus tach in the 120s on the monitor. No s/s of respiratory distress noted. Daughter is at patient's bedside. Will continue to monitor.  Sharlene Dory, RN

## 2013-11-14 NOTE — Progress Notes (Signed)
Ptient with an axillary temp of 102.3 Administered 650 mg of Tylenol suppository PR as per MD order. Will continue to monitor.  Sharlene Dory, RN

## 2013-11-15 ENCOUNTER — Encounter (HOSPITAL_COMMUNITY): Payer: Self-pay | Admitting: Internal Medicine

## 2013-11-15 MED ORDER — LORAZEPAM 0.5 MG PO TABS
0.5000 mg | ORAL_TABLET | Freq: Four times a day (QID) | ORAL | Status: DC | PRN
  Administered 2013-11-15: 0.5 mg via SUBLINGUAL
  Filled 2013-11-15 (×3): qty 1

## 2013-11-15 MED ORDER — MORPHINE SULFATE 2 MG/ML IJ SOLN: 2.0000 mg | INTRAMUSCULAR | Status: DC | PRN

## 2013-11-15 MED ORDER — ATROPINE SULFATE 1 % OP SOLN
2.0000 [drp] | OPHTHALMIC | Status: DC | PRN
  Administered 2013-11-15: 2 [drp] via SUBLINGUAL
  Filled 2013-11-15: qty 2

## 2013-11-15 MED ORDER — MORPHINE SULFATE (CONCENTRATE) 10 MG /0.5 ML PO SOLN
5.0000 mg | ORAL | Status: DC | PRN
  Administered 2013-11-15: 5 mg via ORAL
  Filled 2013-11-15: qty 0.5

## 2013-11-15 MED ORDER — MORPHINE SULFATE (CONCENTRATE) 10 MG /0.5 ML PO SOLN
5.0000 mg | ORAL | Status: DC | PRN
  Administered 2013-11-15: 5 mg via SUBLINGUAL
  Filled 2013-11-15: qty 0.5

## 2013-11-15 MED ORDER — SODIUM CHLORIDE 0.9 % IV SOLN
1.0000 mg/h | INTRAVENOUS | Status: DC
  Administered 2013-11-15: 1 mg/h via INTRAVENOUS
  Filled 2013-11-15: qty 10

## 2013-11-15 MED ORDER — SCOPOLAMINE 1 MG/3DAYS TD PT72
1.0000 | MEDICATED_PATCH | TRANSDERMAL | Status: DC
  Administered 2013-11-15: 1.5 mg via TRANSDERMAL
  Filled 2013-11-15: qty 1

## 2013-11-15 MED ORDER — MORPHINE SULFATE (CONCENTRATE) 10 MG /0.5 ML PO SOLN
10.0000 mg | Freq: Three times a day (TID) | ORAL | Status: DC
  Administered 2013-11-15 (×2): 10 mg via SUBLINGUAL
  Filled 2013-11-15 (×2): qty 0.5

## 2013-11-17 ENCOUNTER — Ambulatory Visit: Payer: Medicare Other

## 2013-11-19 NOTE — Discharge Summary (Signed)
Death Summary  Pamela Hayes ZGY:[id number] DOB: [birth year] DOA: 11/10/13  PCP: Georgianne Fick, MD  Admit date: 11-10-13 Date of Death: 2013-11-16  Final Diagnoses:  Principal Problem:   Abdominal pain, acute Active Problems:   HTN (hypertension)   Hyperlipidemia   IVCD (intraventricular conduction defect)   Chest pain, vague pain, more holding her chest, negative MI   Nausea and vomiting   Malnutrition of moderate degree   Cholecystitis, acute   Cholelithiasis with choledocholithiasis   Palliative care encounter   DNR (do not resuscitate)   History of present illness:  Please see admit h and p from 9/16 for details. Briefly, pt presented with abd pains, found to have multiple gallstones with dilatation of the biliary duct resulting in obstruction. The patient was admitted for further work up.  Hospital Course:  1. Abdominal pain, acute secondary to choledocholithiasis and cholecystitis with sepsis  -CT ABD revealing Multiple Gallstones with Obstruction of the Biliary duct  -Pt was initially seen by General Surgery but pt and family had declined surgery  -GI also consulted  -Pain Control with IVDilaudid, Low dose fentanyl patch added but patch d/c'd by Palliative secondary to concerns of increased delerium  -Pt was s/p ERCP with massively dilated cbd and hepatic ducts with at least 2 stones seen. Stent was placed but patient remained in very poor prognosis - Leukocytosis had remained elevated with high fevers despite abx  - Pt was continued on zosyn  -Overall grim prognosis. Palliative Care was consulted. See below  -Ultimately, pt was continued with comfort measures only per family  2. Nausea and vomiting  -PRN IV anti-emetics  3. HTN (hypertension)  -stopped meds per comfort measures  4. Hyperlipidemia  -stopped per comfort measures  5. IVCD (intraventricular conduction defect)  -Has pacemaker  6. DVT Prophylaxis  -SCD's  7. End of Life  - On 9/20, had family  meeting with majority of family including POA. Family updated on patient's grave prognosis. Family now aware of pt's worsening septic picture despite several days course of IV abx and stent placement. Family again declined any kind of surgical intervention, including percutaneous tube placement. Family was in agreement with home with hospice. Pt was DNR/DNI. - Palliative care continued to follow. Patient was continued on morphine gtt with full comfort measures. With plans for home with home hospice, unfortunately, that patient did not survive the night and was subsequently pronounced on 11-16-13 at 2145/06/11.  Time of Death: 2145-06-11  Signed:  Jerald Kief  [facility] 11/19/2013, 10:38 AM

## 2013-11-25 NOTE — Progress Notes (Signed)
Chaplain responded to request from 3E to be with family of pt who just passed. Some of pt's grandchildren were in hallway, while pt's children were seated at bedside. Visited briefly with two grandchildren in hall, then spent time with pt's 4-5 daughters and one son in pt room. They proudly showed a lovely picture of their mom taken on her 89th birthday earlier this month. She was wearing a royal blue hat. Pt has many grandchildren and scores of great-grandchildren. Family began discussion of where to have funeral service to be able to accommodate large number of people expected. At family's request I led them in prayer. Family expressed appreciation for chaplain support.

## 2013-11-25 NOTE — Progress Notes (Signed)
[facility] PROGRESS NOTE  Pamela Hayes OXB:[id number] DOB: 08-06-1914 DOA: 11/23/2013 PCP: Pamela Fick, MD  Assessment/Plan: 1. Abdominal pain, acute secondary to choledocholithiasis and cholecystitis with sepsis  -CT ABD revealing Multiple Gallstones with Obstruction of the Biliary duct  -Pt was initially seen by General Surgery but pt and family had declined surgery -GI also consulted -Pain Control with IVDilaudid, Low dose fentanyl patch added but patch d/c'd secondary to concerns of increased delerium -Pt is now s/p ERCP with massively dilated cbd and hepatic ducts with at least 2 stones seen. Stent placed - Leukocytosis had remained elevated with high fevers despite abx - Pt is continued on zosyn -Overall grim prognosis. Appreciate Palliative Care input. See below -Comfort measures per family 2. Nausea and vomiting  -PRN IV anti-emetics  3. HTN (hypertension)  -stopping meds per comfort measures 4. Hyperlipidemia  -stopping per comfort measures 5. IVCD (intraventricular conduction defect)  -Has pacemaker  6. DVT Prophylaxis  -SCD's 7. End of Life - On 9/20, had family meeting with majority of family including POA. Family updated on patient's grave prognosis. Family now aware of pt's worsening septic picture despite several days course of IV abx and stent placement. Family again declined any kind of surgical intervention, including percutaneous tube placement. Family is in agreement with home with hospice. Pt is DNR/DNI. - Palliative care following. Now on morphine gtt with full comfort measures. SW working on home hospice. Will follow.  Code Status: DNR Family Communication: Pt and family at bedside Disposition Plan: Pending d/c to home hospice tomorrow if patient survives the night   Consultants:  Surgery  GI  Procedures:    Antibiotics:  Zosyn stopped  HPI/Subjective: Appears more comfortable this AM.  Objective: Filed Vitals:   11/14/13  2113 11/14/13 2247 11/06/2013 0930 11/03/2013 1336  BP: 143/90     Pulse: 122     Temp: 99.4 F (37.4 C) 100.1 F (37.8 C) 103.1 F (39.5 C) 103.4 F (39.7 C)  TempSrc: Axillary Axillary Axillary Axillary  Resp:      Height:      Weight:      SpO2: 100%      No intake or output data in the 24 hours ending 11/24/2013 1450 Filed Weights   Dec 07, 2013 0404 11/13/13 0509 11/14/13 0615  Weight: 40.461 kg (89 lb 3.2 oz) 41.277 kg (91 lb) 41.912 kg (92 lb 6.4 oz)    Exam:   General:  Asleep, laying in bed in nad  Cardiovascular: perfused  Respiratory: normal resp effort, no audible wheezing  Abdomen: nondistended  Musculoskeletal: no clubbing   Data Reviewed: Basic Metabolic Panel:  Recent Labs Lab 11/07/2013 2103 11/10/13 0321 11/11/13 0331 11/14/13 0450  NA 143  141 140 143 153*  Hayes 4.3  4.3 3.3* 4.2 3.4*  CL 104  102 102 105 118*  CO2 25  24 21 19  15*  GLUCOSE 131*  130* 125* 112* 118*  BUN 17  16 13 18  26*  CREATININE 0.74  0.76 0.63 0.80 0.88  CALCIUM 9.4  9.4 8.6 9.0 8.2*   Liver Function Tests:  Recent Labs Lab 11/16/2013 2103 11/11/13 0331 11/14/13 0450  AST 18 35 34  ALT 8 18 22   ALKPHOS 58 69 108  BILITOT 0.2* 0.6 0.6  PROT 7.2 6.9 6.2  ALBUMIN 3.5 3.1* 2.2*    Recent Labs Lab 11/22/2013 2103 11/11/13 0331 11/14/13 0450  LIPASE 30 9* 106*  AMYLASE  --  17 76   No results found  for this basename: AMMONIA,  in the last 168 hours CBC:  Recent Labs Lab 10/30/2013 2103 11/10/13 0321 11/11/13 0331 11/13/13 1527 11/14/13 0450  WBC 7.2 9.4 22.6* 19.1* 20.0*  NEUTROABS  --   --   --   --  17.6*  HGB 12.4 11.9* 11.6* 12.3 11.3*  HCT 36.4 34.6* 34.0* 35.5* 33.4*  MCV 92.2 91.5 92.4 92.4 92.0  PLT 241 228 213 256 237   Cardiac Enzymes: No results found for this basename: CKTOTAL, CKMB, CKMBINDEX, TROPONINI,  in the last 168 hours BNP (last 3 results)  Recent Labs  10/28/2013 2103  PROBNP 850.9*   CBG: No results found for this basename:  GLUCAP,  in the last 168 hours  Recent Results (from the past 240 hour(s))  URINE CULTURE     Status: None   Collection Time    11/23/2013  9:34 PM      Result Value Ref Range Status   Specimen Description URINE, CATHETERIZED   Final   Special Requests ADDED [id number]   Final   Culture  Setup Time     Final   Value: 11/10/2013 03:19     Performed at [facility]   Colony Count     Final   Value: >=100,000 COLONIES/ML     Performed at [facility]   Culture     Final   Value: KLEBSIELLA PNEUMONIAE     Performed at [facility]   Report Status 10/30/2013 FINAL   Final   Organism ID, Bacteria KLEBSIELLA PNEUMONIAE   Final     Studies: No results found.  Scheduled Meds: . aspirin EC  81 mg Oral Daily  . scopolamine  1 patch Transdermal Q72H   Continuous Infusions: . morphine 2 mg/hr (11/01/2013 1344)    Principal Problem:   Abdominal pain, acute Active Problems:   HTN (hypertension)   Hyperlipidemia   IVCD (intraventricular conduction defect)   Chest pain, vague pain, more holding her chest, negative MI   Nausea and vomiting   Malnutrition of moderate degree   Cholecystitis, acute   Cholelithiasis with choledocholithiasis   Palliative care encounter   DNR (do not resuscitate)  Time spent:  Pamela Hayes  [facility] Pager [phone]. If 7PM-7AM, please contact night-coverage at [email or web address], password [facility] 11/22/2013, 2:50 PM  LOS: 6 days

## 2013-11-25 NOTE — Progress Notes (Signed)
IV Morphine Wasted in sink. Witnessed by Courtney Heys RN/BSN.

## 2013-11-25 NOTE — Progress Notes (Signed)
Patient is actively dying. Family notified by patient's daughter Jacques Earthly and are now at patient's bedside. Emotional support provided to patient's family. Patient also repositioned with multiple pillows for comfort. Will continue to monitor.  Sharlene Dory, RN

## 2013-11-25 NOTE — Progress Notes (Signed)
Notified by [facility], patient and family request services of [facility]) after discharge.  Patient seen at bedside; increased work of breathing grunting respirations RR=28; non-responsive to voice or touch; bilateral hands cool, bilateral toes mottled and cold to touch. Spoke with staff RN Junie Panning who has contacted PMT for additional symptom management orders   Spoke with patients daughters, 2 granddtrs and son at bedside initially and then away from pt in conference room. Family shared they have had previous family members cared for by [facility] and are familiar with services, philosophy and team approach to care. Daughter Jola Babinski shared that patient has had PCS services 3hrs/day prior to this hospital stay- they do not wish to continue this service as they shared that they are aware their mother is going home to die and feel that hospice care will best meet her needs at this time. Writer discussed with family that [facility] support the family's care in the home and would not have staff who remained in the home for prolonged periods of time. -Family voiced that they have been told that their mom is "transitioning" discussed s/sx they are seeing that would indicate pt may have very limited time left (changes in skin color/mottling, temperature changes, increased work of breathing and respiratory changes, etc.).  - Discussed the increased level of care their mother now requires as she is bed bound, incontinent and needing increased comfort medications for pain and increased work of breathing. Dtr Jola Babinski stated that her mom would want to be at home and even though Jola Babinski knows there may be a possibility her mother could pass en-route she would be comfortable knowing she tried to get her home  -Son O.C. stated that the family understands that the primary team is trying to stabilize their mother through the night and "if the doctors feel she is stable to transport home tomorrow",  they would like to expedite her transport home by [facility]. All family members agreed they to try to proceed with transport home once DME is in the home 'if patient's doctors felt she could transport home' *Please send completed GOLD DNR form home with patient *Discharge RX for Liquid Concentrated Morphine 10 mg/0.5 ml give 5 mg SL TID scheduled and PRN Q 2hrs pain, SOB  *Dispense Quantity =30 ml *  DME needs discussed family and [facility] DME manager Loistine Simas to request complete Package D : fully electric hospital bed with full rails, AP&P mattress pad, over-bed table to be delivered TOMORROW, Tuesday 11/16/13 before 12 noon -contact dtr Nettie @ [phone] to arrange delivery  Initial paperwork faxed to [facility]  Please notify [facility] when patient is ready to leave unit at d/c call [phone] (or [phone] if after 5 pm); [facility] information and contact numbers also given to daughters and son during visit.   Above information to be shared with CMRN in am as meeting lasted till after 5 pm Writer will follow up with primary team and family in the morning    Valente David, RN 07-Dec-2013, 5:16 PM [facility] RN Liaison [phone]

## 2013-11-25 NOTE — Progress Notes (Signed)
Report given to receiving RN. Patient in bed sleeping, responding to touch, with family at bedside. No signs or symptoms of distress noted.

## 2013-11-25 NOTE — Progress Notes (Signed)
Subjective: Patient continues to decline, now tachypneic with increased respiratory secretions.Family at bedside, and wants to take her home with hospice.   Filed Vitals:   11/08/2013 0930  BP:   Pulse:   Temp: 103.1 F (39.5 C)  Resp:     Chest: bilateral rhonchi, tachypenic Heart : S1S2 RRR Abdomen: Soft, nontender Ext : No edema Neuro: obtunded  A/P 80 year old female admitted with sepsis due to cholecystitis, status post ERCP and stent placement, continues to be in sepsis, delirium. Family understands the poor prognosis, and are willing to take the patient home with hospice Will start the process for home hospice In the meantime, I was started patient on morphine infusion at 1 mg per hour for worsening dyspnea, will also continue scopolamine patch every 72 hours for secretions. We'll start atropineophthalmic 1% sublingual when necessary for respiratory secretions    Meredeth Ide Palliative Medicine Team [phone]

## 2013-11-25 NOTE — Progress Notes (Signed)
Patient's respiratory status has changed. Patient with pursed lipped breathing noted, and respirations are 24 breaths per minute. 0.5 mg of Dilaudid administered as per MD order to decrease labored breathing. Daughter at patient's bedside and education provided r/t the active dying process.  Will continue to monitor.  Sharlene Dory, RN

## 2013-11-25 NOTE — Care Management Note (Addendum)
    Page 1 of 2   10/30/2013     4:03:34 PM CARE MANAGEMENT NOTE 10/28/2013  Patient:  Pamela Hayes, Pamela Hayes   Account Number:  [id number]  Date Initiated:  11/16/2013  Documentation initiated by:  [facility]  Subjective/Objective Assessment:   80 y.o. female with a history of CAD, Hyperlipidemia, HTN,pvd, and previous DVT of the RLE who was brought to the ED due to sudden onset of constant severe 10/10 RUQ and Epigastric Area pain with N,V since 6 pm.  //Home with daughter.     Action/Plan:   CT ABD performed and revealed Multiple Gallstones with obstruction of the Biliary duct; [place] ABD ordered; General Surgery Consulted for Options; IV Dilaudid. //Access for disposition needs.   Anticipated DC Date:  11/16/2013   Anticipated DC Plan:  HOME W HOSPICE CARE  In-house referral  Chaplain      DC Planning Services  CM consult      PAC Choice  HOSPICE   Choice offered to / List presented to:  C-4 Adult Children           HH agency  [facility]   Status of service:  In process, will continue to follow Medicare Important Message given?  YES (If response is "NO", the following Medicare IM given date fields will be blank) Date Medicare IM given:  11/07/2013 Medicare IM given by:  [facility] Date Additional Medicare IM given:   Additional Medicare IM given by:    Discharge Disposition:  HOME W HOSPICE CARE  Per UR Regulation:  Reviewed for med. necessity/level of care/duration of stay  If discussed at Long Length of Stay Meetings, dates discussed:   11/11/2013  11/16/2013    Comments:  10/27/2013 1345 Troi Bechtold Lucretia Roers, RN, BSN, NCM [phone] Spoke to family concerning choice for Home Hospice.  [facility] chose [facility] to render services.  Valente David of [facility] ntoified.  10/28/2013 1030 Sagan Maselli, RN, BSN, [employer] [phone] End of Life - On 9/20, had family meeting with majority of family including POA. Family updated  on patient's grave prognosis. Family now aware of pt's worsening septic picture despite several days course of IV abx and stent placement. Family again declined any kind of surgical intervention, including percutaneous tube placement. Family is in agreement with home with hospice. Pt is DNR/DNI. - Palliative care following. Now on comfort measures. NCM working on home hospice. Will follow. Code Status: DNR Family Communication: Pt and family at bedside Disposition Plan: Pending d/c to home hospice tomorrow if patient survives the night.  NCM gave adult children list of home hospice to choose from.  Will follow up in AM.

## 2013-11-25 NOTE — Progress Notes (Signed)
      Gastroenterology Progress Note  Subjective:   S/P ERCP with stent.Pt still in pain, moaning.   Objective:  Vital signs in last 24 hours: Temp:  [98.8 F (37.1 C)-103.1 F (39.5 C)] 103.1 F (39.5 C) (09/21 0930) Pulse Rate:  [122-134] 122 (09/20 2113) Resp:  [16-18] 16 (09/20 1609) BP: (143-194)/(81-93) 143/90 mmHg (09/20 2113) SpO2:  [98 %-100 %] 100 % (09/20 2113) Last BM Date: 11/11/13 General:    Well-developed, AA female, moaning in pain Heart:  Regular rate and rhythm; no murmurs Pulmlungs clear Abdomen:  Soft, tender RUQ  Extremities:  Without edema. Neurologic:  Alert and  oriented x4;  grossly normal neurologically. Psych:  Alert and cooperative. Normal mood and affect.     Lab Results:  Recent Labs  11/13/13 1527 11/14/13 0450  WBC 19.1* 20.0*  HGB 12.3 11.3*  HCT 35.5* 33.4*  PLT 256 237   BMET  Recent Labs  11/14/13 0450  NA 153*  K 3.4*  CL 118*  CO2 15*  GLUCOSE 118*  BUN 26*  CREATININE 0.88  CALCIUM 8.2*   LFT  Recent Labs  11/14/13 0450  PROT 6.2  ALBUMIN 2.2*  AST 34  ALT 22  ALKPHOS 108  BILITOT 0.6    ASSESSMENT/PLAN:  1. Choledocholithiasis and cholecystitis with sepsis.  Pt'S family declines surgery. Family aware of prognosis. Family working with palliative care and have agreed on hospice. Pt is currently DNR/DNI. GI  Will sign off at this time.     LOS: 6 days   Hvozdovic, Tollie Pizza PA-C 05-Dec-2013, Pager [phone]  ________________________________________________________________________  [facility] MD note:  I personally examined the patient, reviewed the data and agree with the assessment and plan described above.   Rob Bunting, MD [facility] Gastroenterology Pager [phone]

## 2013-11-25 NOTE — Progress Notes (Signed)
Care RN and NT called to room for repositioning of patient and pain medicine.  Vital signs obtained at this time. O2 sat 67% on RA.  TMAX 103.1.  Tylenol PR given per order.  Patient turned from right side to back per family request.  Morphine given per order.  Patient coughed while administering morphine.  Patient went unresponsive at this time.  Second telemetry nurse in room to assess. Patient pronounced at 2145-06-21.  NP on call made aware and family present at bedside. Louretta Parma, RN

## 2013-11-25 NOTE — Progress Notes (Signed)
9/21 paged by staff RN to assess and assist with patient palliative comfort care.  Patient in bed surrounded by family.  Patient had been started on MS gtt this AM although IV has now infiltrated.  Bedside assessment done, orders received for change in symptom management medication to S/L roxanol.    Patient temp of 103. Ax.  Tylenol suppository given as ordered, S/L roxanol also administered by staff RN.Marland Kitchen Patient adjusted in bed, oral care done and sheets changed for comfort.    RNCM arrived and discussed home hospice with family.  Hospice referral completed.  Several family members at bedside, very attentive. Symptom management discussed, comfort emphasized.  Patient appears more comfortable after med and physical comfort measures.  Cindra Presume, RN, MSN, [facility] Palliative Integration

## 2013-11-25 NOTE — Clinical Documentation Improvement (Signed)
Medicare rules require specification as to whether an inpatient diagnosis was present at the time of admission.     Please clarify if the following diagnosis:Sepsis  was:       Present at the time of admission   NOT present at the time of inpatient admission and it developed during the inpatient stay   Unable to clinically determine whether the condition was present on admission.   Documentation insufficient to determine if condition was present at the time of inpatient admission    Thank Patsy Lager, Clinical Documentation Specialist:  [phone]  [facility]- Health Information Management

## 2013-11-25 NOTE — Progress Notes (Signed)
Received verbal order from nurse practitioner Tom callahan to hold patient's 0600 dose of Zosyn secondary to patient actively dying at present time.  Sharlene Dory, RN

## 2013-11-25 DEATH — deceased

## 2014-10-04 IMAGING — CT CT ABD-PELV W/ CM
2 of 5 series · 16 of 46 positions shown, 18 images · IV contrast (APPLIED)
Comparison: CT of the abdomen and pelvis on 12/11/2009

CLINICAL DATA: Nausea and vomiting.  Diffuse abdominal pain.

EXAM:
CT ABDOMEN AND PELVIS WITH CONTRAST
TECHNIQUE: Multidetector CT imaging of the abdomen and pelvis was performed
using the standard protocol following bolus administration of
intravenous contrast.
CONTRAST:  80mL OMNIPAQUE IOHEXOL 300 MG/ML  SOLN

[Series 2: abd/ pelvis 5.0 i30f 1 · axial · 0.66mm/px · z∈[-511,-156]mm · 13 of 81 slices shown, 15 images]
[im 5/81  soft-tissue]
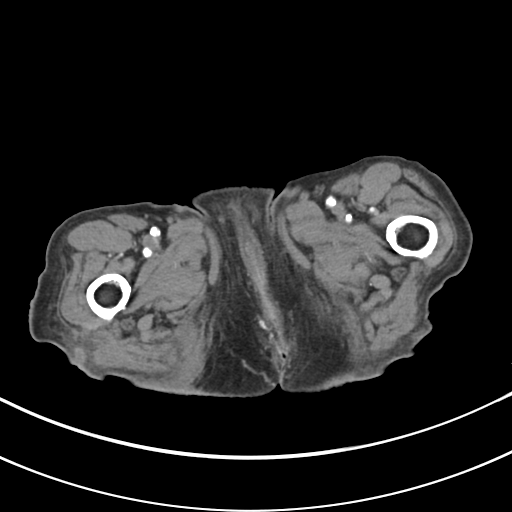
[im 5/81  bone]
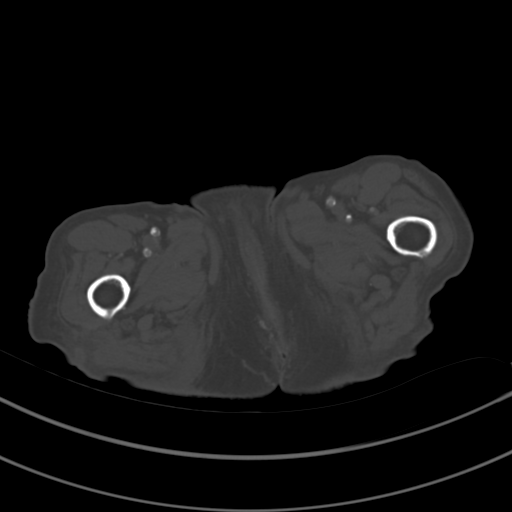
[im 13/81  soft-tissue]
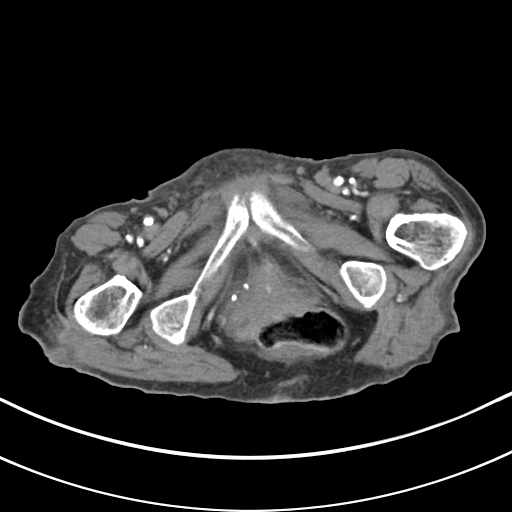
[im 17/81  soft-tissue]
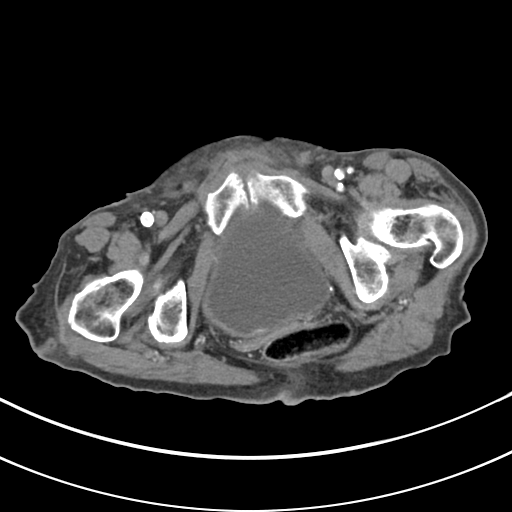
[im 22/81  soft-tissue]
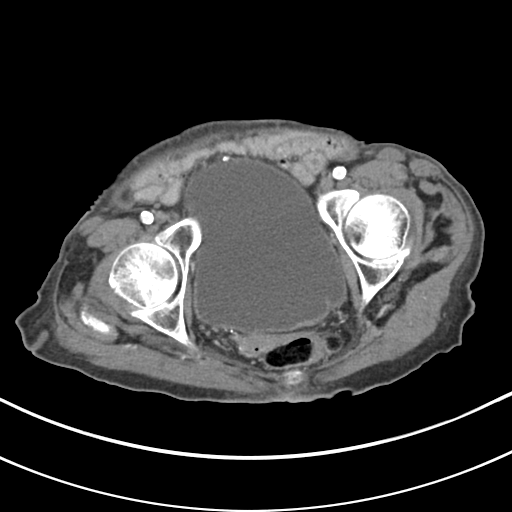
[im 30/81  soft-tissue]
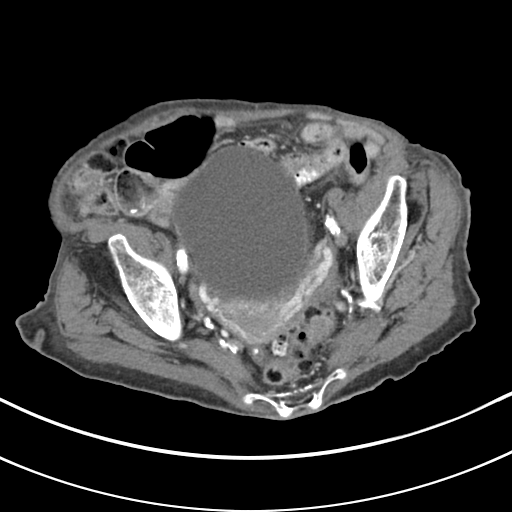
[im 34/81  soft-tissue]
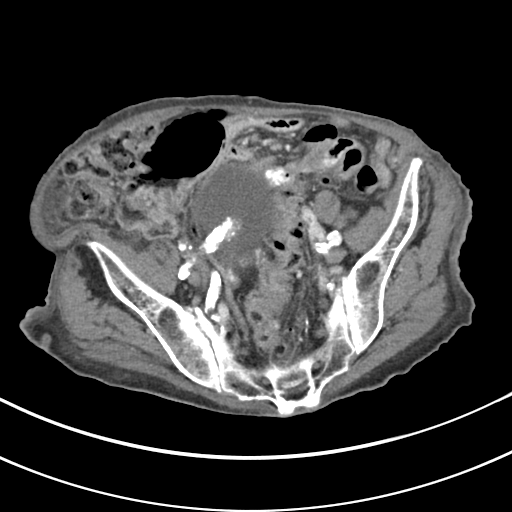
[im 43/81  soft-tissue]
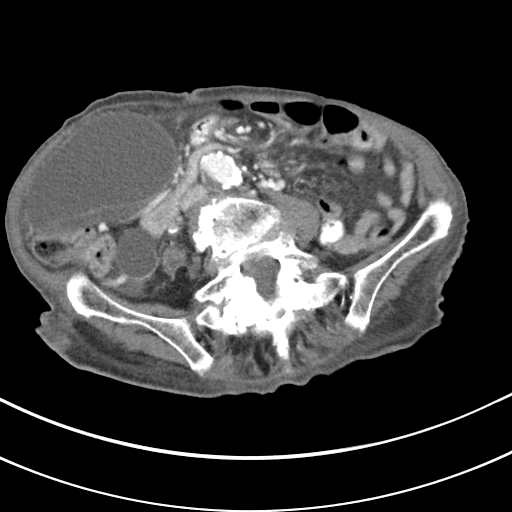
[im 47/81  soft-tissue]
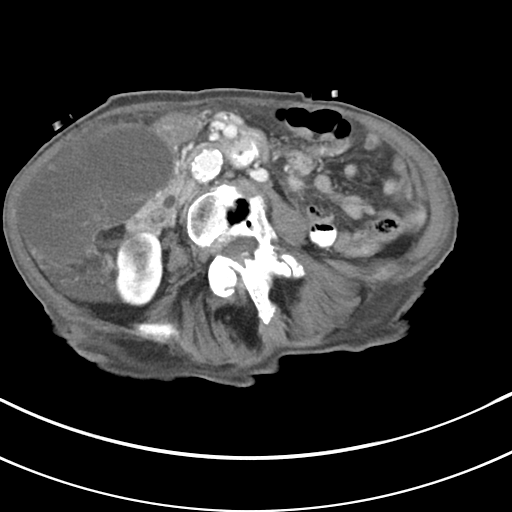
[im 51/81  soft-tissue]
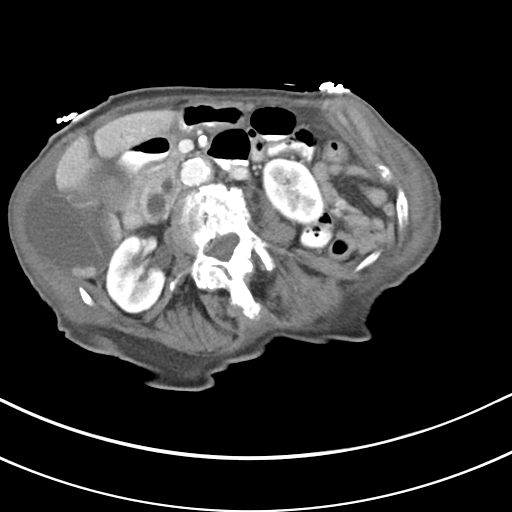
[im 51/81  bone]
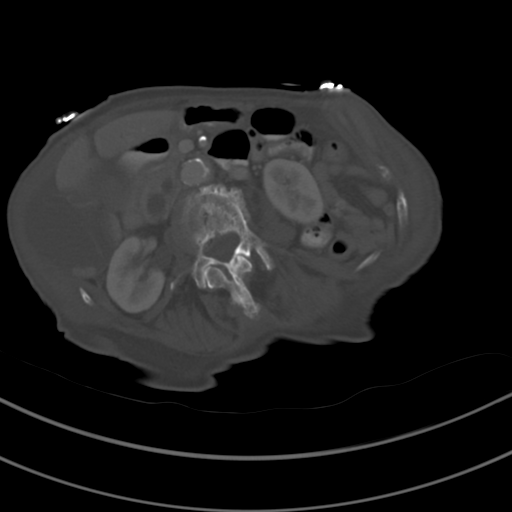
[im 59/81  soft-tissue]
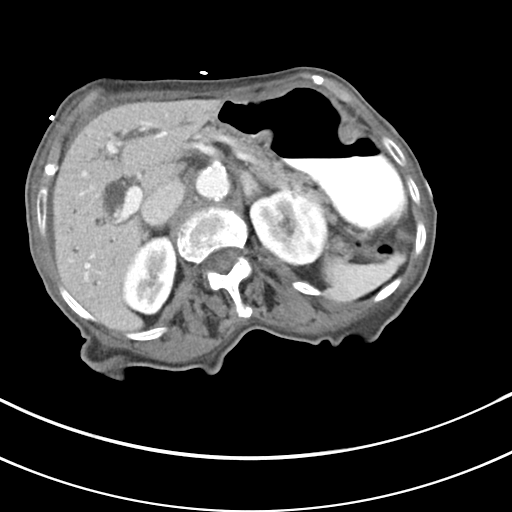
[im 64/81  soft-tissue]
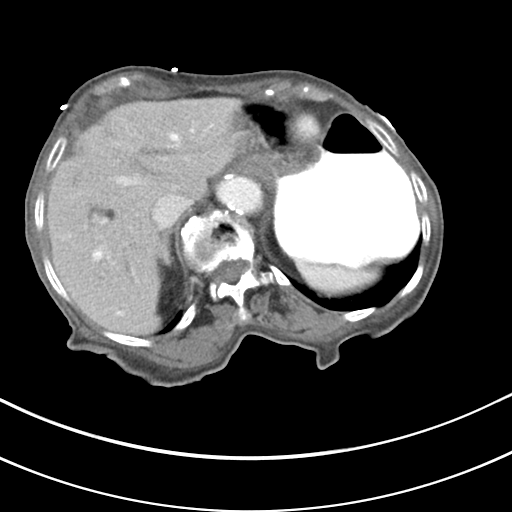
[im 68/81  soft-tissue]
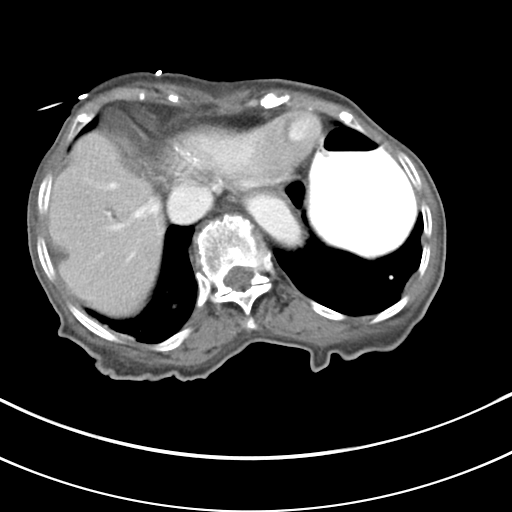
[im 76/81  soft-tissue]
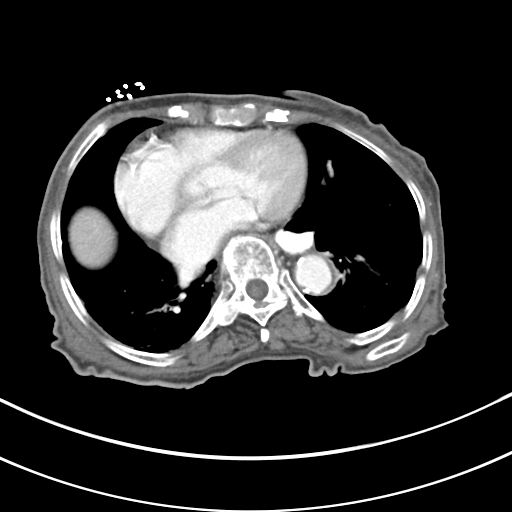

[Series 5: coronal soft tissue · coronal · 0.76mm/px · 3 of 73 slices shown]
[im 25/73  soft-tissue]
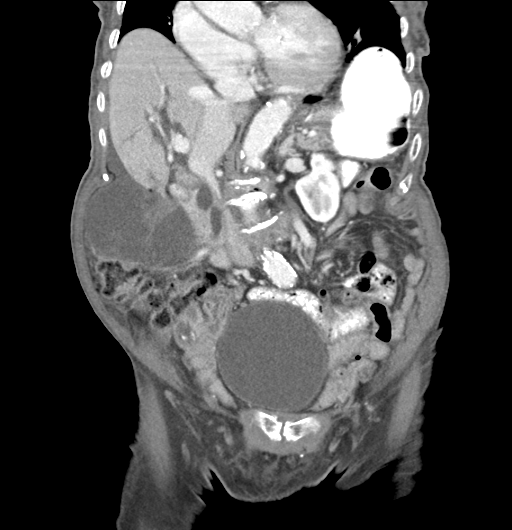
[im 33/73  soft-tissue]
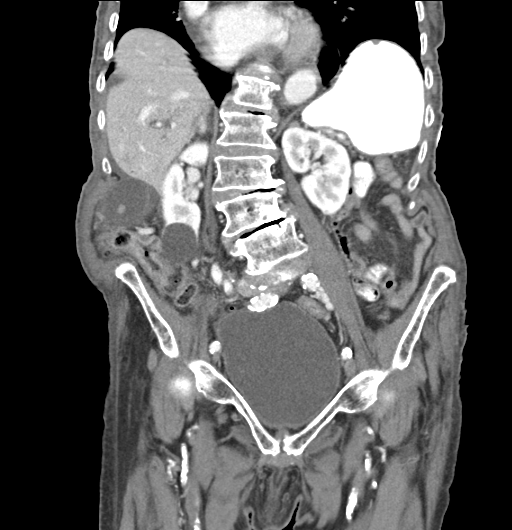
[im 41/73  soft-tissue]
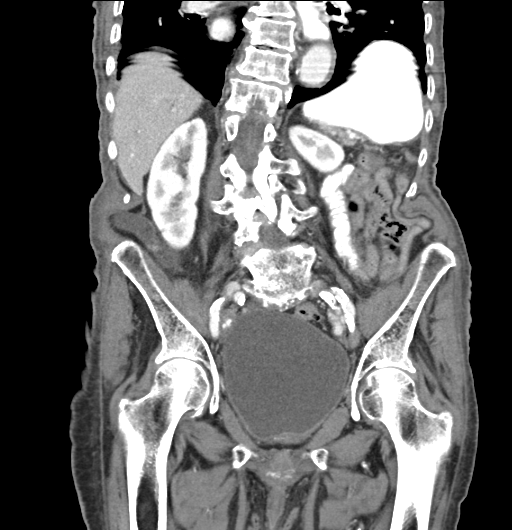

[16 of 46 positions shown; findings below may reference images not displayed]

FINDINGS: Lower chest: The heart is enlarged. Dense coronary artery
calcification is present.

Upper abdomen: There is marked dilatation of the intrahepatic and
extrahepatic bile ducts. The gallbladder is distended and contains
numerous stones. Hyperdense oval masses are also identified within
the common bile duct measuring up to 1.5 cm, likely representing
obstructing gallstones. The pancreatic duct is also dilated. Within
the gallbladder, numerous septations are identified. This raises
question of gallbladder wall edema. Further evaluation with
ultrasound is recommended.

No focal abnormality is identified within the pancreas of the liver,
spleen. The right kidney has a lower pole cyst which measures
cm. The left kidney has a normal appearance.

Bowel: Colonic diverticula are present. Stomach, duodenum, and small
bowel loops are normal in appearance.

Pelvis: The uterus is present. The urinary bladder is distended. No
adnexal mass identified.

Retroperitoneum: There is dense atherosclerotic calcification of the
aorta. No aneurysm.

Abdominal wall: Unremarkable.

Osseous structures: Marked degenerative changes and scoliosis.
IMPRESSION: 1. Marked distention of the gallbladder containing numerous stones.
Appearance of septations within the gallbladder, raising the
question of gallbladder wall edema.
2. Dilated intra and extrahepatic biliary ducts with
choledocholithiasis. Distal common duct stricture cannot be entirely
excluded but is not definitely identified. There is associated
dilatation of the pancreatic duct. Further evaluation with abdominal
ultrasound is recommended.
3. Cardiomegaly, coronary artery calcifications.
4. Right renal cyst.
5. Diverticulosis.
6. Scoliosis.
7. Atherosclerosis.
